# Patient Record
Sex: Female | Born: 1955
Health system: Southern US, Community
[De-identification: ages and names within clinical notes are randomized; demographics above are authoritative.]

## PROBLEM LIST (undated history)

## (undated) DIAGNOSIS — Z86718 Personal history of other venous thrombosis and embolism: Secondary | ICD-10-CM

## (undated) DIAGNOSIS — I10 Essential (primary) hypertension: Secondary | ICD-10-CM

## (undated) DIAGNOSIS — E785 Hyperlipidemia, unspecified: Secondary | ICD-10-CM

## (undated) DIAGNOSIS — Z8711 Personal history of peptic ulcer disease: Secondary | ICD-10-CM

## (undated) DIAGNOSIS — Z87442 Personal history of urinary calculi: Secondary | ICD-10-CM

## (undated) DIAGNOSIS — Z973 Presence of spectacles and contact lenses: Secondary | ICD-10-CM

## (undated) HISTORY — DX: Essential (primary) hypertension: I10

## (undated) HISTORY — DX: Hyperlipidemia, unspecified: E78.5

## (undated) HISTORY — DX: Personal history of urinary calculi: Z87.442

---

## 1989-01-27 DIAGNOSIS — Z86718 Personal history of other venous thrombosis and embolism: Secondary | ICD-10-CM

## 1989-01-27 HISTORY — DX: Personal history of other venous thrombosis and embolism: Z86.718

## 1992-01-28 HISTORY — PX: TUBAL LIGATION: SHX77

## 1996-01-28 DIAGNOSIS — Z8711 Personal history of peptic ulcer disease: Secondary | ICD-10-CM

## 1996-01-28 HISTORY — DX: Personal history of peptic ulcer disease: Z87.11

## 1997-07-03 ENCOUNTER — Ambulatory Visit (HOSPITAL_COMMUNITY): Admission: RE | Admit: 1997-07-03 | Discharge: 1997-07-03 | Payer: Self-pay | Admitting: Internal Medicine

## 1997-12-19 ENCOUNTER — Other Ambulatory Visit: Admission: RE | Admit: 1997-12-19 | Discharge: 1997-12-19 | Payer: Self-pay | Admitting: Internal Medicine

## 1998-11-17 ENCOUNTER — Emergency Department (HOSPITAL_COMMUNITY): Admission: EM | Admit: 1998-11-17 | Discharge: 1998-11-17 | Payer: Self-pay | Admitting: Emergency Medicine

## 1998-12-04 ENCOUNTER — Ambulatory Visit (HOSPITAL_COMMUNITY): Admission: RE | Admit: 1998-12-04 | Discharge: 1998-12-04 | Payer: Self-pay | Admitting: *Deleted

## 1999-02-07 ENCOUNTER — Encounter (INDEPENDENT_AMBULATORY_CARE_PROVIDER_SITE_OTHER): Payer: Self-pay | Admitting: *Deleted

## 1999-02-07 ENCOUNTER — Ambulatory Visit (HOSPITAL_COMMUNITY): Admission: RE | Admit: 1999-02-07 | Discharge: 1999-02-07 | Payer: Self-pay | Admitting: *Deleted

## 1999-08-12 ENCOUNTER — Encounter: Payer: Self-pay | Admitting: Internal Medicine

## 1999-08-12 ENCOUNTER — Ambulatory Visit (HOSPITAL_COMMUNITY): Admission: RE | Admit: 1999-08-12 | Discharge: 1999-08-12 | Payer: Self-pay | Admitting: Internal Medicine

## 2001-07-15 ENCOUNTER — Other Ambulatory Visit: Admission: RE | Admit: 2001-07-15 | Discharge: 2001-07-15 | Payer: Self-pay | Admitting: Family Medicine

## 2002-01-25 ENCOUNTER — Encounter: Admission: RE | Admit: 2002-01-25 | Discharge: 2002-01-25 | Payer: Self-pay | Admitting: Internal Medicine

## 2002-01-25 ENCOUNTER — Encounter: Payer: Self-pay | Admitting: Internal Medicine

## 2002-11-01 ENCOUNTER — Other Ambulatory Visit: Admission: RE | Admit: 2002-11-01 | Discharge: 2002-11-01 | Payer: Self-pay | Admitting: Family Medicine

## 2002-11-16 ENCOUNTER — Ambulatory Visit (HOSPITAL_COMMUNITY): Admission: RE | Admit: 2002-11-16 | Discharge: 2002-11-16 | Payer: Self-pay | Admitting: Family Medicine

## 2002-11-16 ENCOUNTER — Encounter: Payer: Self-pay | Admitting: Family Medicine

## 2002-11-21 ENCOUNTER — Encounter: Admission: RE | Admit: 2002-11-21 | Discharge: 2002-11-21 | Payer: Self-pay | Admitting: Family Medicine

## 2002-11-21 ENCOUNTER — Encounter: Payer: Self-pay | Admitting: Family Medicine

## 2003-11-02 ENCOUNTER — Other Ambulatory Visit: Admission: RE | Admit: 2003-11-02 | Discharge: 2003-11-02 | Payer: Self-pay | Admitting: Family Medicine

## 2003-11-22 ENCOUNTER — Encounter (INDEPENDENT_AMBULATORY_CARE_PROVIDER_SITE_OTHER): Payer: Self-pay | Admitting: *Deleted

## 2003-11-22 ENCOUNTER — Other Ambulatory Visit: Admission: RE | Admit: 2003-11-22 | Discharge: 2003-11-22 | Payer: Self-pay | Admitting: Hematology and Oncology

## 2003-11-23 ENCOUNTER — Encounter: Admission: RE | Admit: 2003-11-23 | Discharge: 2003-11-23 | Payer: Self-pay | Admitting: Family Medicine

## 2003-12-06 ENCOUNTER — Ambulatory Visit: Payer: Self-pay | Admitting: Hematology and Oncology

## 2004-11-18 ENCOUNTER — Ambulatory Visit: Payer: Self-pay | Admitting: Family Medicine

## 2004-11-24 ENCOUNTER — Encounter: Admission: RE | Admit: 2004-11-24 | Discharge: 2004-11-24 | Payer: Self-pay | Admitting: Family Medicine

## 2004-12-12 ENCOUNTER — Encounter: Admission: RE | Admit: 2004-12-12 | Discharge: 2004-12-12 | Payer: Self-pay | Admitting: Family Medicine

## 2005-02-05 ENCOUNTER — Ambulatory Visit: Payer: Self-pay | Admitting: Internal Medicine

## 2006-06-30 ENCOUNTER — Encounter: Admission: RE | Admit: 2006-06-30 | Discharge: 2006-06-30 | Payer: Self-pay | Admitting: Internal Medicine

## 2007-04-09 LAB — HM COLONOSCOPY: HM Colonoscopy: NORMAL

## 2007-07-23 ENCOUNTER — Encounter: Admission: RE | Admit: 2007-07-23 | Discharge: 2007-07-23 | Payer: Self-pay | Admitting: Internal Medicine

## 2008-08-14 ENCOUNTER — Encounter: Admission: RE | Admit: 2008-08-14 | Discharge: 2008-08-14 | Payer: Self-pay | Admitting: Obstetrics and Gynecology

## 2009-09-24 ENCOUNTER — Encounter: Admission: RE | Admit: 2009-09-24 | Discharge: 2009-09-24 | Payer: Self-pay | Admitting: Obstetrics and Gynecology

## 2010-06-14 NOTE — Procedures (Signed)
[facility]  Patient:    Brandy May                          MRN: [id number] Proc. Date: 02/07/99 Adm. Date:  [id number] Attending:  Sharyn Dross                           Procedure Report  REFERRING PHYSICIAN:  Sharyn Dross., M.D.  PREOPERATIVE DIAGNOSIS: 1. History of peptic ulcer disease with recurrent abdominal pain. 2. Found to have gastritis, as well as, esophagitis noted.  POSTOPERATIVE DIAGNOSIS:  Grossly normal endoscopic examination.  PROCEDURE:  Esophagogastroduodenoscopy.  MEDICATIONS:  Demerol 40 mg IV, Versed 4 mg IV over 10 minute period of time.  INSTRUMENT:  Olympus video panendoscope.  ENDOSCOPIST:  Sharyn Dross., M.D.  INDICATIONS:  This pleasant 55 year old female was recently seen and treated for persistent abdominal pains not responding to PPIs.  She was found to have to have gastritis, as well as, esophagitis that was present.  She was also checked for . pylori that was noted.  She was subsequently brought back in for reevaluation of the area at this time.  OBJECTIVE FINDINGS:  She is a pleasant female, appears to be in no acute distress. Her vital signs are stable.  HEENT EXAMINATION:  Anicteric.  NECK:  Supple.  LUNGS:  Clear.  HEART:  Regular rate and rhythm without heaves, thrills, murmurs or gallops.  ABDOMEN: Soft, mild tenderness to palpation, no rebound or referred tenderness appreciated.  DIGITAL RECTAL EXAMINATION:  Deferred.  EXTREMITIES:  Within normal limits.  PLAN:  Proceed with the endoscopic examination.  INFORMED CONSENT:  The patient was advised of procedure, indications and the risk involved.  The patient has agreed to have the procedure performed.  PREOPERATIVE PREPARATION:  The patient was brought in the endoscopy unit with an IV, for IV conscious sedating medication was started.  A monitor is placed on the patient to monitor the patients vital signs and oxygen  saturation.  Nasal oxygen at 2 L/min was used and after adequate sedation was performed, the procedure was begun.  PROCEDURE NOTE:  The instrument was advanced, the patient lying in the left lateral position via direct technique without difficulty.  The oropharyngeal, epiglottis, vocal cords and piriform sinuses appeared to be grossly within normal limits. he esophagus is normal without any evidence of acute inflammation, ulcerations, hiatal hernia or varices appreciated.  The gastric area showed a normal mucosa lake without any evidence of acute inflammation or ulcerations that was noted.  The antral area appeared to be within normal limits at this time.  The pylorus is normal with good peristaltic activity and upon advancing to the pyloric canal, duodenal bulb and second portion appeared to be within normal limits.  The instrument was retracted back, where biopsies or the CLO study is performed.  Retroflexed view of the cardia revealed no gross pathology at this time.  The Z-line appeared to be approximately 40 cm distal esophagus that was noted.  The instrument was retracted back.  There was no evidence of any postendoscopic trauma noted.  The instrument was subsequently removed per orum without difficulty with the tolerating the procedure well.  TREATMENT: 1. Conservative management during the interim at this time. 2. Await the results of the CLO study. 3. Have the patient follow-up in the office on routine. DD:  02/07/99 TD:  02/07/99 Job: [id number]  [id number]

## 2010-06-14 NOTE — Procedures (Signed)
. [facility]  Patient:    Brandy May                          MRN: [id number] Proc. Date: 12/04/98 Adm. Date:  [id number] Attending:  Sharyn Dross CC:         Lindell Spar. Chestine Spore, M.D.                           Procedure Report  PREOPERATIVE DIAGNOSIS:  Abdominal pains.  POSTOPERATIVE DIAGNOSIS: 1. Distal esophagitis. 2. Mild duodenitis in the postbulbar region.  PROCEDURE PERFORMED:  Esophagogastroduodenoscopy with biopsies.  MEDICATIONS USED:  Demerol 40 mg IV, Versed 5 mg IV over a 10-minute period of time.  INSTRUMENT USED:  Olympus video panendoscope.  ENDOSCOPIST:  Sharyn Dross., M.D.  INDICATIONS:  This pleasant female was referred for evaluation because of persistent abdominal pains and discomforts.  The pains appeared to be in the periumbilical region at this time.  Her pains were not associated with any nauseousness or discomforts at this time.  The patient does admit to having a history of peptic ulcer disease in the past.  The patient has not been treated or ulcerations for a long period of time.  The patient denies any history of any hematemesis or melenotic stools that was noted at this time.  OBJECTIVE FINDINGS:  She is a pleasant female who appears to be in no acute distress.  Her vital signs are stable.  Her HEENT examination is anicteric. The neck was supple.  Lungs were clear.  The heart had a regular rate and rhythm without heaves, thrills, murmurs or gallops.  The abdomen was soft.  There was ild tenderness to palpation in the superumbilical region.  No rebound or referred tenderness appreciated.  Digital rectal exam was deferred.  Extremities showed o clubbing, cyanosis or edema.  PLAN:  I am going to proceed with the endoscopic examination.  INFORMED CONSENT:  The patient was advised of the procedure, indications and the risks involved.  The patient has agreed to have the procedure performed at  this  time.  A video was reviewed and the consent form was obtained.  PREOPERATIVE PREPARATION:  The patient was brought to the endoscopy unit where n IV for IV conscious sedating medication was started.  A monitor was placed on the patient to monitor the patients vital signs and oxygen saturation.  Nasal oxygen at two liters per minute was used and after adequate sedation was performed, the procedure was begun.  DESCRIPTION OF PROCEDURE:  The instrument was advanced with the patient in the eft lateral position via the direct technique without difficulty.  The oropharyngeal epliglottis, vocal cords and piriform sinuses appeared to be grossly within normal limits.  The esophagus was normal without any evidence of acute inflammation, ulcerations, or varices appreciated.  There was evidence of another distal esophagitis that was appreciated appeared to be mild in character at this time.  Photographs were taken of the region.  The gastric area showed a normal mucous lake without any evidence of acute inflammation or ulcerations that was noted.  The pylorus was normal with good peristaltic activity.  Upon advancement through the pyloric canal, the duodenal  bulb showed evidence of postbulbar duodenitis that was noted.  The second portion of the duodenum appeared to be unremarkable.  The instrument was subsequently retracted back where the biopsy for the CLO study was  performed.  Retroflex view of the cardia revealed no evidence of a hiatal hernia and the Z-line appeared to be 40 cm distal esophagus.  The instrument was retracted back where again the inflammation was noted at this time.  There did not appear to be evidence of a Barretts esophagus that was appreciated at this time.  The instrument was subsequently removed per orum without difficulty.  The patient tolerated the procedure well.  TREATMENT: 1. Will treat the patient with a PPI at this time. 2. Have the patient  follow up with me in the office in the next few weeks. 3. Await the results of the CLO study depending on the results to determine the  course of therapy. DD:  12/04/98 TD:  12/05/98 Job: [id number]

## 2010-07-19 ENCOUNTER — Ambulatory Visit (HOSPITAL_BASED_OUTPATIENT_CLINIC_OR_DEPARTMENT_OTHER)
Admission: RE | Admit: 2010-07-19 | Discharge: 2010-07-19 | Disposition: A | Discharge status: Home / Self Care | Payer: PRIVATE HEALTH INSURANCE | Source: Ambulatory Visit | Attending: Orthopedic Surgery | Admitting: Orthopedic Surgery

## 2010-07-19 DIAGNOSIS — M23302 Other meniscus derangements, unspecified lateral meniscus, unspecified knee: Secondary | ICD-10-CM | POA: Insufficient documentation

## 2010-07-19 DIAGNOSIS — Z01810 Encounter for preprocedural cardiovascular examination: Secondary | ICD-10-CM | POA: Insufficient documentation

## 2010-07-19 DIAGNOSIS — I10 Essential (primary) hypertension: Secondary | ICD-10-CM | POA: Insufficient documentation

## 2010-07-19 DIAGNOSIS — Z01812 Encounter for preprocedural laboratory examination: Secondary | ICD-10-CM | POA: Insufficient documentation

## 2010-07-19 HISTORY — PX: KNEE ARTHROSCOPY: SUR90

## 2010-07-19 LAB — POCT HEMOGLOBIN-HEMACUE: Hemoglobin: 14.2 g/dL (ref 12.0–15.0)

## 2010-07-24 NOTE — Op Note (Signed)
  Brandy May, Brandy May                ACCOUNT NO.:  [id number]  MEDICAL RECORD NO.:  [id number]  LOCATION:                                 FACILITY:  PHYSICIAN:  Marlowe Kays, M.D.  DATE OF BIRTH:  [birth year]  DATE OF PROCEDURE:  07/19/2010 DATE OF DISCHARGE:                              OPERATIVE REPORT   PREOPERATIVE DIAGNOSIS:  Torn lateral meniscus, right knee.  POSTOPERATIVE DIAGNOSIS:  Torn lateral meniscus, right knee.  OPERATION:  Right knee arthroscopy with partial lateral meniscectomy.  SURGEON:  Marlowe Kays, M.D.  ASSISTANT:  Nurse.  ANESTHESIA:  General.  PLAN/JUSTIFICATION FOR PROCEDURE:  Because of painful right knee since November of 2011, had an MRI performed on March 22, 2010, which demonstrated a tear of the lateral meniscus which appeared to be partially discoid.  The remainder of the knee looked relatively unremarkable.  Because of persistent symptomatology, she is here today for the above-mentioned surgery.  DESCRIPTION OF PROCEDURE:  Satisfied general anesthesia, Ace wrap and knee support to left lower extremity, pneumatic tourniquet applied to right lower extremity with leg Esmarch out nonsterilely and tourniquet inflated to 350 mmHg.  Thigh stabilizer applied.  Right leg was then prepped with DuraPrep from stabilizer to ankle, draped in sterile field. Time-out performed.  Superomedial saline inflow.  First an anterolateral portal of medial compartment of knee joint was evaluated. Representative pictures were taken.  It was normal.  Looking at the medial gutter and subpatellar area, no significant wear of the patella was noted.  I then reversed portals.  ACL looked intact.  She had a very superficial defect in the lateral femoral condyle articular cartilage adjacent to the underlying tear of the midportion of the lateral meniscus.  There was also small tear in the posterior curve as well. After picturing these, I used a 3.5 shaver to be able  to shave down both tears to stable rim on probing.  Final pictures were taken.  Knee joint was irrigated to clear all fluid possible removed,  closed 2 entry portals with a 4-0 nylon and then injected through the inflow apparatus 20 cc of 0.5% Marcaine with adrenaline, 4 mg of morphine.  I then closed this portal with 4-0 nylon as well.  Betadine, Adaptic, dry sterile dressing were applied.  Tourniquet was released.  At time of this dictation, she was on her way to recovery room in satisfactory condition with no known complications.          ______________________________ Marlowe Kays, M.D.     JA/MEDQ  D:  07/19/2010  T:  07/19/2010  Job:  [id number]  Electronically Signed by Marlowe Kays M.D. on 07/24/2010 02:23:15 PM

## 2010-09-02 ENCOUNTER — Other Ambulatory Visit: Payer: Self-pay | Admitting: Obstetrics and Gynecology

## 2010-09-02 DIAGNOSIS — Z1231 Encounter for screening mammogram for malignant neoplasm of breast: Secondary | ICD-10-CM

## 2010-10-01 ENCOUNTER — Ambulatory Visit
Admission: RE | Admit: 2010-10-01 | Discharge: 2010-10-01 | Disposition: A | Discharge status: Home / Self Care | Payer: PRIVATE HEALTH INSURANCE | Source: Ambulatory Visit | Attending: Obstetrics and Gynecology | Admitting: Obstetrics and Gynecology

## 2010-10-01 DIAGNOSIS — Z1231 Encounter for screening mammogram for malignant neoplasm of breast: Secondary | ICD-10-CM

## 2010-10-08 ENCOUNTER — Other Ambulatory Visit: Payer: Self-pay | Admitting: Obstetrics and Gynecology

## 2010-10-08 DIAGNOSIS — R928 Other abnormal and inconclusive findings on diagnostic imaging of breast: Secondary | ICD-10-CM

## 2010-10-25 ENCOUNTER — Ambulatory Visit
Admission: RE | Admit: 2010-10-25 | Discharge: 2010-10-25 | Disposition: A | Discharge status: Home / Self Care | Payer: PRIVATE HEALTH INSURANCE | Source: Ambulatory Visit | Attending: Obstetrics and Gynecology | Admitting: Obstetrics and Gynecology

## 2010-10-25 DIAGNOSIS — R928 Other abnormal and inconclusive findings on diagnostic imaging of breast: Secondary | ICD-10-CM

## 2010-11-29 ENCOUNTER — Ambulatory Visit: Payer: PRIVATE HEALTH INSURANCE | Admitting: Family Medicine

## 2010-12-17 ENCOUNTER — Ambulatory Visit (INDEPENDENT_AMBULATORY_CARE_PROVIDER_SITE_OTHER): Payer: PRIVATE HEALTH INSURANCE | Admitting: Family Medicine

## 2010-12-17 ENCOUNTER — Encounter: Payer: Self-pay | Admitting: Family Medicine

## 2010-12-17 DIAGNOSIS — Z86718 Personal history of other venous thrombosis and embolism: Secondary | ICD-10-CM

## 2010-12-17 DIAGNOSIS — Z8759 Personal history of other complications of pregnancy, childbirth and the puerperium: Secondary | ICD-10-CM

## 2010-12-17 DIAGNOSIS — I10 Essential (primary) hypertension: Secondary | ICD-10-CM

## 2010-12-17 DIAGNOSIS — E538 Deficiency of other specified B group vitamins: Secondary | ICD-10-CM

## 2010-12-17 DIAGNOSIS — E785 Hyperlipidemia, unspecified: Secondary | ICD-10-CM

## 2010-12-17 NOTE — Patient Instructions (Signed)
Follow up in March to repeat your cholesterol tests You look great!  Keep up the good work! Call with any questions or concerns Welcome!  We're glad to have you! Happy Holidays!!!

## 2010-12-17 NOTE — Progress Notes (Signed)
  Subjective:    Patient ID: Brandy May, female    DOB: [birth year], 55 y.o.   MRN: [id number]  HPI New to establish.  Previous MD- Dr Elisabeth Most.  GYN- Cousins.  UTD on pap and mammo.  UTD on colonoscopy (age 6).  HTN- chronic problem, on Norvasc.  No Cp, SOB, HAs, visual changes, edema.  Hyperlipidemia- chronic problem, on Crestor.  Last labs done 10/01/10.  No N/V, myalgias.  Vit B12 deficiency- according to recent labs pt was deficient.  Taking oral B12- not injxns.  Hx of DVT w/ 2nd pregnancy.  No clots since.  Not on anticoagulation.   Review of Systems For ROS see HPI     Objective:   Physical Exam  Vitals reviewed. Constitutional: She is oriented to person, place, and time. She appears well-developed and well-nourished. No distress.  HENT:  Head: Normocephalic and atraumatic.  Eyes: Conjunctivae and EOM are normal. Pupils are equal, round, and reactive to light.  Neck: Normal range of motion. Neck supple. No thyromegaly present.  Cardiovascular: Normal rate, regular rhythm, normal heart sounds and intact distal pulses.   No murmur heard. Pulmonary/Chest: Effort normal and breath sounds normal. No respiratory distress.  Abdominal: Soft. She exhibits no distension. There is no tenderness.  Musculoskeletal: She exhibits no edema.  Lymphadenopathy:    She has no cervical adenopathy.  Neurological: She is alert and oriented to person, place, and time.  Skin: Skin is warm and dry.  Psychiatric: She has a normal mood and affect. Her behavior is normal.          Assessment & Plan:

## 2010-12-29 DIAGNOSIS — I10 Essential (primary) hypertension: Secondary | ICD-10-CM | POA: Insufficient documentation

## 2010-12-29 DIAGNOSIS — E538 Deficiency of other specified B group vitamins: Secondary | ICD-10-CM | POA: Insufficient documentation

## 2010-12-29 DIAGNOSIS — E785 Hyperlipidemia, unspecified: Secondary | ICD-10-CM | POA: Insufficient documentation

## 2010-12-29 DIAGNOSIS — Z86718 Personal history of other venous thrombosis and embolism: Secondary | ICD-10-CM | POA: Insufficient documentation

## 2010-12-29 NOTE — Assessment & Plan Note (Signed)
Chronic problem.  Well controlled on current meds.  Asymptomatic.  No changes. 

## 2010-12-29 NOTE — Assessment & Plan Note (Signed)
Chronic problem.  On Crestor- tolerating well.  Asymptomatic.  Labs done in September.  No changes.

## 2010-12-29 NOTE — Assessment & Plan Note (Signed)
New based on recent labs.  Taking oral supplement and not injxns.  Will need repeat labs to determine if oral absorption is adequate.

## 2010-12-29 NOTE — Assessment & Plan Note (Signed)
DVT occurred during her 2nd pregnancy.  No problems since.  Will follow.

## 2011-04-11 ENCOUNTER — Encounter: Payer: Self-pay | Admitting: Family Medicine

## 2011-04-11 ENCOUNTER — Ambulatory Visit (INDEPENDENT_AMBULATORY_CARE_PROVIDER_SITE_OTHER): Payer: PRIVATE HEALTH INSURANCE | Admitting: Family Medicine

## 2011-04-11 VITALS — BP 120/85 | HR 84 | Temp 98.4°F | Ht 65.75 in | Wt 160.8 lb

## 2011-04-11 DIAGNOSIS — E538 Deficiency of other specified B group vitamins: Secondary | ICD-10-CM

## 2011-04-11 DIAGNOSIS — E785 Hyperlipidemia, unspecified: Secondary | ICD-10-CM

## 2011-04-11 DIAGNOSIS — E010 Iodine-deficiency related diffuse (endemic) goiter: Secondary | ICD-10-CM

## 2011-04-11 DIAGNOSIS — E049 Nontoxic goiter, unspecified: Secondary | ICD-10-CM

## 2011-04-11 DIAGNOSIS — I10 Essential (primary) hypertension: Secondary | ICD-10-CM

## 2011-04-11 LAB — CBC WITH DIFFERENTIAL/PLATELET
Basophils Absolute: 0 10*3/uL (ref 0.0–0.1)
Basophils Relative: 0.7 % (ref 0.0–3.0)
Eosinophils Absolute: 0.1 10*3/uL (ref 0.0–0.7)
Eosinophils Relative: 2.4 % (ref 0.0–5.0)
Lymphocytes Relative: 28.2 % (ref 12.0–46.0)
Lymphs Abs: 1 10*3/uL (ref 0.7–4.0)
Monocytes Absolute: 0.3 10*3/uL (ref 0.1–1.0)
Neutrophils Relative %: 61.8 % (ref 43.0–77.0)
Platelets: 209 10*3/uL (ref 150.0–400.0)
RDW: 13 % (ref 11.5–14.6)

## 2011-04-11 LAB — LIPID PANEL
HDL: 45.8 mg/dL (ref >39.00)
Total CHOL/HDL Ratio: 3
Triglycerides: 72 mg/dL (ref 0.0–149.0)
VLDL: 14.4 mg/dL (ref 0.0–40.0)

## 2011-04-11 LAB — BASIC METABOLIC PANEL
BUN: 15 mg/dL (ref 6–23)
CO2: 27 mEq/L (ref 19–32)
Chloride: 105 mEq/L (ref 96–112)
GFR: 94.74 mL/min (ref >60.00)
Glucose, Bld: 98 mg/dL (ref 70–99)
Potassium: 3.7 mEq/L (ref 3.5–5.1)

## 2011-04-11 LAB — TSH: TSH: 0.69 u[IU]/mL (ref 0.35–5.50)

## 2011-04-11 LAB — HEPATIC FUNCTION PANEL
ALT: 17 U/L (ref 0–35)
Total Protein: 7.7 g/dL (ref 6.0–8.3)

## 2011-04-11 NOTE — Progress Notes (Signed)
Addended by: Silvio Pate D on: 04/11/2011 04:46 PM   Modules accepted: Orders

## 2011-04-11 NOTE — Progress Notes (Signed)
  Subjective:    Patient ID: Brandy May, female    DOB: [birth year], 56 y.o.   MRN: [id number]  HPI HTN- chronic problem, well controlled on Norvasc.  No CP, SOB, HAs, visual changes, edema.  Hyperlipidemia- chronic problem, on Crestor.  No abd pain, N/V/D, myalgias.  B12 deficiency- taking oral B12, no fatigue, pallor.  Due for labs.   Review of Systems For ROS see HPI     Objective:   Physical Exam  Vitals reviewed. Constitutional: She is oriented to person, place, and time. She appears well-developed and well-nourished. No distress.  HENT:  Head: Normocephalic and atraumatic.  Eyes: Conjunctivae and EOM are normal. Pupils are equal, round, and reactive to light.  Neck: Normal range of motion. Neck supple. No thyromegaly present.  Cardiovascular: Normal rate, regular rhythm, normal heart sounds and intact distal pulses.   No murmur heard. Pulmonary/Chest: Effort normal and breath sounds normal. No respiratory distress.  Abdominal: Soft. She exhibits no distension. There is no tenderness.  Musculoskeletal: She exhibits no edema.  Lymphadenopathy:    She has no cervical adenopathy.  Neurological: She is alert and oriented to person, place, and time.  Skin: Skin is warm and dry.  Psychiatric: She has a normal mood and affect. Her behavior is normal.          Assessment & Plan:

## 2011-04-11 NOTE — Assessment & Plan Note (Signed)
New.  Get labs and US to assess. 

## 2011-04-11 NOTE — Patient Instructions (Signed)
We'll notify you of your ultrasound appt We'll notify you of your lab results and make any changes if needed Keep up the good work!  You look great!!! Schedule your complete physical for September Call with any questions or concerns Happy Bel[employer] Birthday!

## 2011-04-11 NOTE — Assessment & Plan Note (Signed)
Chronic problem.  Well controlled.  Asymptomatic.  No changes at this time. 

## 2011-04-11 NOTE — Assessment & Plan Note (Signed)
Taking oral supplement.  Check labs.  Start parenteral replacement prn.

## 2011-04-11 NOTE — Assessment & Plan Note (Signed)
Chronic problem.  Tolerating statin w/out difficulty.  Check labs.  Adjust meds prn  

## 2011-04-17 ENCOUNTER — Encounter: Payer: Self-pay | Admitting: *Deleted

## 2011-04-18 ENCOUNTER — Other Ambulatory Visit (HOSPITAL_BASED_OUTPATIENT_CLINIC_OR_DEPARTMENT_OTHER): Payer: PRIVATE HEALTH INSURANCE

## 2011-04-18 ENCOUNTER — Ambulatory Visit: Payer: PRIVATE HEALTH INSURANCE | Admitting: Family Medicine

## 2011-04-18 ENCOUNTER — Ambulatory Visit (HOSPITAL_BASED_OUTPATIENT_CLINIC_OR_DEPARTMENT_OTHER)
Admission: RE | Admit: 2011-04-18 | Discharge: 2011-04-18 | Disposition: A | Discharge status: Home / Self Care | Payer: PRIVATE HEALTH INSURANCE | Source: Ambulatory Visit | Attending: Family Medicine | Admitting: Family Medicine

## 2011-04-18 DIAGNOSIS — E042 Nontoxic multinodular goiter: Secondary | ICD-10-CM

## 2011-04-18 DIAGNOSIS — E010 Iodine-deficiency related diffuse (endemic) goiter: Secondary | ICD-10-CM

## 2011-05-19 ENCOUNTER — Other Ambulatory Visit: Payer: Self-pay | Admitting: Family Medicine

## 2011-05-19 MED ORDER — AMLODIPINE BESYLATE 5 MG PO TABS: 5.0000 mg | ORAL_TABLET | Freq: Every day | ORAL | Status: DC

## 2011-05-19 NOTE — Telephone Encounter (Signed)
refill Amlodipine Besylate 5MG  tab Qty 30 Take 1-tablet every day  Last filled 03.13.2013  Last OV 03.15.2013

## 2011-05-19 NOTE — Telephone Encounter (Signed)
rx sent to pharmacy by e-script  

## 2011-05-30 ENCOUNTER — Telehealth: Payer: Self-pay | Admitting: Family Medicine

## 2011-05-30 MED ORDER — ROSUVASTATIN CALCIUM 10 MG PO TABS: 10.0000 mg | ORAL_TABLET | Freq: Every day | ORAL | Status: DC

## 2011-05-30 NOTE — Telephone Encounter (Signed)
rx sent to pharmacy by e-script  

## 2011-05-30 NOTE — Telephone Encounter (Signed)
Refill: Crestor 10mg  tablet #30. Take 1 tablet by mouth every day. Last fill 04-18-11

## 2011-09-21 ENCOUNTER — Other Ambulatory Visit: Payer: Self-pay | Admitting: Family Medicine

## 2011-09-22 NOTE — Telephone Encounter (Signed)
rx sent to pharmacy by e-script  

## 2011-09-30 ENCOUNTER — Encounter: Payer: PRIVATE HEALTH INSURANCE | Admitting: Family Medicine

## 2011-10-03 ENCOUNTER — Encounter: Payer: Self-pay | Admitting: Family Medicine

## 2011-10-03 ENCOUNTER — Ambulatory Visit (INDEPENDENT_AMBULATORY_CARE_PROVIDER_SITE_OTHER): Payer: Commercial Managed Care - PPO | Admitting: Family Medicine

## 2011-10-03 VITALS — BP 124/83 | HR 94 | Temp 98.2°F | Ht 65.0 in | Wt 161.8 lb

## 2011-10-03 DIAGNOSIS — Z0000 Encounter for general adult medical examination without abnormal findings: Secondary | ICD-10-CM | POA: Insufficient documentation

## 2011-10-03 DIAGNOSIS — E538 Deficiency of other specified B group vitamins: Secondary | ICD-10-CM

## 2011-10-03 DIAGNOSIS — I10 Essential (primary) hypertension: Secondary | ICD-10-CM

## 2011-10-03 DIAGNOSIS — E785 Hyperlipidemia, unspecified: Secondary | ICD-10-CM

## 2011-10-03 LAB — LIPID PANEL
Cholesterol: 143 mg/dL (ref 0–200)
HDL: 48.9 mg/dL (ref >39.00)
VLDL: 12.6 mg/dL (ref 0.0–40.0)

## 2011-10-03 LAB — CBC WITH DIFFERENTIAL/PLATELET
Basophils Absolute: 0 10*3/uL (ref 0.0–0.1)
Basophils Relative: 0.6 % (ref 0.0–3.0)
Eosinophils Absolute: 0.1 10*3/uL (ref 0.0–0.7)
Eosinophils Relative: 2.1 % (ref 0.0–5.0)
HCT: 39.9 % (ref 36.0–46.0)
Hemoglobin: 13.2 g/dL (ref 12.0–15.0)
Lymphocytes Relative: 34.5 % (ref 12.0–46.0)
MCHC: 33.2 g/dL (ref 30.0–36.0)
Monocytes Relative: 6 % (ref 3.0–12.0)
Neutrophils Relative %: 56.8 % (ref 43.0–77.0)
Platelets: 183 10*3/uL (ref 150.0–400.0)
RBC: 3.87 Mil/uL (ref 3.87–5.11)
RDW: 12.9 % (ref 11.5–14.6)

## 2011-10-03 LAB — BASIC METABOLIC PANEL
CO2: 22 mEq/L (ref 19–32)
Creatinine, Ser: 0.8 mg/dL (ref 0.4–1.2)
GFR: 90.68 mL/min (ref >60.00)

## 2011-10-03 LAB — HEPATIC FUNCTION PANEL
ALT: 13 U/L (ref 0–35)
AST: 21 U/L (ref 0–37)
Albumin: 3.9 g/dL (ref 3.5–5.2)
Alkaline Phosphatase: 46 U/L (ref 39–117)

## 2011-10-03 LAB — TSH: TSH: 1.2 u[IU]/mL (ref 0.35–5.50)

## 2011-10-03 NOTE — Assessment & Plan Note (Signed)
Chronic problem, check labs.  Adjust meds prn. 

## 2011-10-03 NOTE — Assessment & Plan Note (Signed)
Pt's PE WNL- small multinodular goiter (ultrasound done 3/13).  UTD on GYN and colonoscopy.  Check labs.  Anticipatory guidance provided.

## 2011-10-03 NOTE — Progress Notes (Signed)
  Subjective:    Patient ID: Brandy May, female    DOB: [birth year], 56 y.o.   MRN: [id number]  HPI CPE- PT UTD on GYN ([facility]) and colonoscopy.  No concerns today.  BP well controlled.   Review of Systems Patient reports no vision/ hearing changes, adenopathy,fever, weight change,  persistant/recurrent hoarseness , swallowing issues, chest pain, palpitations, edema, persistant/recurrent cough, hemoptysis, dyspnea (rest/exertional/paroxysmal nocturnal), gastrointestinal bleeding (melena, rectal bleeding), abdominal pain, significant heartburn, bowel changes, GU symptoms (dysuria, hematuria, incontinence), Gyn symptoms (abnormal  bleeding, pain),  syncope, focal weakness, memory loss, numbness & tingling, skin/hair/nail changes, abnormal bruising or bleeding, anxiety, or depression.     Objective:   Physical Exam General Appearance:    Alert, cooperative, no distress, appears stated age  Head:    Normocephalic, without obvious abnormality, atraumatic  Eyes:    PERRL, conjunctiva/corneas clear, EOM's intact, fundi    benign, both eyes  Ears:    Normal TM's and external ear canals, both ears  Nose:   Nares normal, septum midline, mucosa normal, no drainage    or sinus tenderness  Throat:   Lips, mucosa, and tongue normal; teeth and gums normal  Neck:   Supple, symmetrical, trachea midline, no adenopathy;    Thyroid: small multinodular goiter  Back:     Symmetric, no curvature, ROM normal, no CVA tenderness  Lungs:     Clear to auscultation bilaterally, respirations unlabored  Chest Wall:    No tenderness or deformity   Heart:    Regular rate and rhythm, S1 and S2 normal, no murmur, rub   or gallop  Breast Exam:    Deferred to GYN  Abdomen:     Soft, non-tender, bowel sounds active all four quadrants,    no masses, no organomegaly  Genitalia:    Deferred to GYN  Rectal:    Extremities:   Extremities normal, atraumatic, no cyanosis or edema  Pulses:   2+ and symmetric all extremities    Skin:   Skin color, texture, turgor normal, no rashes or lesions  Lymph nodes:   Cervical, supraclavicular, and axillary nodes normal  Neurologic:   CNII-XII intact, normal strength, sensation and reflexes    throughout          Assessment & Plan:

## 2011-10-03 NOTE — Patient Instructions (Addendum)
Follow up in 6 months to recheck cholesterol and BP You look great!  Keep up the good work! We'll notify you of your lab results Have a wonderful trip!!!

## 2011-10-03 NOTE — Assessment & Plan Note (Signed)
Chronic problem, well controlled.  Asymptomatic.  No changes. 

## 2011-10-03 NOTE — Assessment & Plan Note (Signed)
Check labs.  Replete prn. 

## 2011-10-08 ENCOUNTER — Encounter: Payer: Self-pay | Admitting: *Deleted

## 2011-10-30 ENCOUNTER — Other Ambulatory Visit: Payer: Self-pay | Admitting: Obstetrics and Gynecology

## 2011-10-30 DIAGNOSIS — Z1231 Encounter for screening mammogram for malignant neoplasm of breast: Secondary | ICD-10-CM

## 2011-10-31 ENCOUNTER — Ambulatory Visit
Admission: RE | Admit: 2011-10-31 | Discharge: 2011-10-31 | Disposition: A | Discharge status: Home / Self Care | Payer: Commercial Managed Care - PPO | Source: Ambulatory Visit | Attending: Obstetrics and Gynecology | Admitting: Obstetrics and Gynecology

## 2011-10-31 DIAGNOSIS — Z1231 Encounter for screening mammogram for malignant neoplasm of breast: Secondary | ICD-10-CM

## 2011-11-03 ENCOUNTER — Other Ambulatory Visit: Payer: Self-pay | Admitting: Obstetrics and Gynecology

## 2011-11-03 DIAGNOSIS — R928 Other abnormal and inconclusive findings on diagnostic imaging of breast: Secondary | ICD-10-CM

## 2011-11-14 ENCOUNTER — Ambulatory Visit
Admission: RE | Admit: 2011-11-14 | Discharge: 2011-11-14 | Disposition: A | Discharge status: Home / Self Care | Payer: Commercial Managed Care - PPO | Source: Ambulatory Visit | Attending: Obstetrics and Gynecology | Admitting: Obstetrics and Gynecology

## 2011-11-14 ENCOUNTER — Other Ambulatory Visit: Payer: Self-pay | Admitting: Family Medicine

## 2011-11-14 DIAGNOSIS — R928 Other abnormal and inconclusive findings on diagnostic imaging of breast: Secondary | ICD-10-CM

## 2011-11-14 MED ORDER — AMLODIPINE BESYLATE 5 MG PO TABS: 5.0000 mg | ORAL_TABLET | Freq: Every day | ORAL | Status: DC

## 2011-11-14 NOTE — Telephone Encounter (Signed)
rx sent to pharmacy by e-script  

## 2011-11-14 NOTE — Telephone Encounter (Signed)
refill amlodipine besylate 5mg  tab #30 wt/5-refills -- Take 1 tablet by mouth daily -- last fill 9.18.13  last ov 9.6.13 V70

## 2011-11-26 ENCOUNTER — Telehealth: Payer: Self-pay | Admitting: Family Medicine

## 2011-11-26 MED ORDER — ROSUVASTATIN CALCIUM 10 MG PO TABS: 10.0000 mg | ORAL_TABLET | Freq: Every day | ORAL | Status: DC

## 2011-11-26 NOTE — Telephone Encounter (Signed)
rx sent to pharmacy by e-script  

## 2011-11-26 NOTE — Telephone Encounter (Signed)
Refill: Crestor 10mg  tablet. Take 1 tablet by mouth daily. Qty 30. Last fill 9.24.13

## 2011-12-29 ENCOUNTER — Ambulatory Visit (INDEPENDENT_AMBULATORY_CARE_PROVIDER_SITE_OTHER): Payer: Commercial Managed Care - PPO | Admitting: Internal Medicine

## 2011-12-29 ENCOUNTER — Encounter: Payer: Self-pay | Admitting: Internal Medicine

## 2011-12-29 VITALS — BP 148/82 | HR 84 | Temp 98.0°F | Wt 166.0 lb

## 2011-12-29 DIAGNOSIS — N390 Urinary tract infection, site not specified: Secondary | ICD-10-CM | POA: Insufficient documentation

## 2011-12-29 LAB — POCT URINALYSIS DIPSTICK
Ketones, UA: NEGATIVE
Protein, UA: NEGATIVE
Spec Grav, UA: 1.01
pH, UA: 6.5

## 2011-12-29 MED ORDER — CIPROFLOXACIN HCL 500 MG PO TABS: 500.0000 mg | ORAL_TABLET | Freq: Two times a day (BID) | ORAL | Status: DC

## 2011-12-29 NOTE — Patient Instructions (Addendum)
Drink plenty of fluids, take ciprofloxacin twice a day for 3 days, call if you are not completely well  in few days, call if the symptoms return.

## 2011-12-29 NOTE — Assessment & Plan Note (Signed)
Sx and Udip c/w UTI Empiric Cipro, see instructions

## 2011-12-29 NOTE — Progress Notes (Signed)
  Subjective:    Patient ID: Brandy May, female    DOB: [birth year], 56 y.o.   MRN: [id number]  HPI Acute visit Last week developed mild urinary urgency, 3 days ago developed terminal dysuria. She has been taking cranberry juice. No UTI in the  last 20 years, the last one was severe and required a Foley catheter.  Past Medical History  Diagnosis Date  . Chicken pox   . Ulcer   . Allergy   . Hyperlipidemia   . Clotting disorder     only with second pregnancy  . Blood transfusion   . Hypertension    Past Surgical History  Procedure Date  . Tubal ligation 1994  . Knee arthroplasty 2012  . Cesarean section 1989 & 1991    Review of Systems No fever or chills No flank or abdominal pain. No nausea, vomiting. She does have a feeling that can't empty her bladder all the way. Last menstrual period 2 weeks ago, on birth control pills.     Objective:   Physical Exam General -- alert, well-developed, and well-nourished.   Abdomen--soft, minimal tenderness at the suprapubic area without mass or rebound; no distention, no CVA tenderness  Neurologic-- alert & oriented X3 and strength normal in all extremities. Psych-- Cognition and judgment appear intact. Alert and cooperative with normal attention span and concentration.  not anxious appearing and not depressed appearing.       Assessment & Plan:

## 2011-12-31 LAB — URINE CULTURE

## 2012-01-28 HISTORY — PX: OTHER SURGICAL HISTORY: SHX169

## 2012-01-30 ENCOUNTER — Encounter: Payer: Self-pay | Admitting: Family Medicine

## 2012-01-30 ENCOUNTER — Ambulatory Visit (INDEPENDENT_AMBULATORY_CARE_PROVIDER_SITE_OTHER): Payer: Commercial Managed Care - PPO | Admitting: Family Medicine

## 2012-01-30 VITALS — BP 130/80 | HR 98 | Temp 98.7°F | Ht 64.75 in | Wt 164.2 lb

## 2012-01-30 DIAGNOSIS — R1032 Left lower quadrant pain: Secondary | ICD-10-CM

## 2012-01-30 MED ORDER — CYCLOBENZAPRINE HCL 10 MG PO TABS: 10.0000 mg | ORAL_TABLET | Freq: Three times a day (TID) | ORAL | Status: DC | PRN

## 2012-01-30 MED ORDER — NAPROXEN 500 MG PO TABS: 500.0000 mg | ORAL_TABLET | Freq: Two times a day (BID) | ORAL | Status: DC

## 2012-01-30 NOTE — Patient Instructions (Addendum)
This appears to be consistent w/ tendonitis Start the Naproxen twice daily, w/ food, for 7-10 days and then as needed Use the flexeril at night for sleep and muscle spasm Heat or ice- whichever feels better If no improvement in 2-3 weeks, call me and we'll set you up w/ ortho Call with any questions or concerns Hang in there! Happy New Year!!!

## 2012-01-30 NOTE — Progress Notes (Signed)
  Subjective:    Patient ID: Brandy May, female    DOB: [birth year], 57 y.o.   MRN: [id number]  HPI L groin pain- sxs started 'at least September, maybe earlier'.  Initially thought it was a pulled muscle from walking but pain has not improved.  Pain has actually worsened somewhat.  Pain w/ hip flexion.  Mild weakness in leg- 'maybe it's b/c i'm favoring it'.  No numbness or tingling.  No changes to bowel/bladder habits.  Has not tried OTC meds.  Pain will improve w/ continued activity.  Now having trouble getting into comfortable position at night.   Review of Systems For ROS see HPI     Objective:   Physical Exam  Vitals reviewed. Constitutional: She is oriented to person, place, and time. She appears well-developed and well-nourished. No distress.  Cardiovascular: Intact distal pulses.   Musculoskeletal: She exhibits tenderness (over large hip flexor tendon). She exhibits no edema.       No pain w/ full hip flexion but pain w/ extension to return to normal position Pain w/ external rotation, right over hip flexor Some pain w/ internal rotation SI joints appear symmetric, nontender, no apparent leg length inequality  Neurological: She is alert and oriented to person, place, and time. She has normal reflexes. No cranial nerve deficit. Coordination normal.          Assessment & Plan:

## 2012-01-30 NOTE — Assessment & Plan Note (Signed)
New.  Suspect this is due to hip flexor tendonitis but may be true hip pathology if no improvement w/ NSAIDs and flexeril.  Reviewed supportive care and red flags that should prompt return.  Pt expressed understanding and is in agreement w/ plan.

## 2012-02-03 ENCOUNTER — Other Ambulatory Visit: Payer: Self-pay | Admitting: *Deleted

## 2012-02-03 DIAGNOSIS — E7800 Pure hypercholesterolemia, unspecified: Secondary | ICD-10-CM

## 2012-02-03 NOTE — Telephone Encounter (Signed)
Patient called traige wanting crestor savings card and samples. Both placed up front, pt will have mother pick up today.

## 2012-02-04 ENCOUNTER — Other Ambulatory Visit: Payer: Self-pay | Admitting: *Deleted

## 2012-02-04 MED ORDER — ROSUVASTATIN CALCIUM 10 MG PO TABS: 10.0000 mg | ORAL_TABLET | Freq: Every day | ORAL | Status: DC

## 2012-03-13 ENCOUNTER — Other Ambulatory Visit: Payer: Self-pay

## 2012-04-01 ENCOUNTER — Encounter: Payer: Self-pay | Admitting: Lab

## 2012-04-02 ENCOUNTER — Ambulatory Visit: Payer: Commercial Managed Care - PPO | Admitting: Family Medicine

## 2012-04-15 ENCOUNTER — Encounter: Payer: Self-pay | Admitting: Lab

## 2012-04-16 ENCOUNTER — Encounter: Payer: Self-pay | Admitting: Family Medicine

## 2012-04-16 ENCOUNTER — Ambulatory Visit (INDEPENDENT_AMBULATORY_CARE_PROVIDER_SITE_OTHER): Payer: Commercial Managed Care - PPO | Admitting: Family Medicine

## 2012-04-16 VITALS — BP 118/80 | HR 87 | Temp 98.7°F | Ht 64.75 in | Wt 155.2 lb

## 2012-04-16 DIAGNOSIS — I10 Essential (primary) hypertension: Secondary | ICD-10-CM

## 2012-04-16 DIAGNOSIS — E785 Hyperlipidemia, unspecified: Secondary | ICD-10-CM

## 2012-04-16 LAB — BASIC METABOLIC PANEL
CO2: 29 mEq/L (ref 19–32)
GFR: 95.75 mL/min (ref >60.00)
Glucose, Bld: 108 mg/dL — ABNORMAL HIGH (ref 70–99)
Potassium: 3.5 mEq/L (ref 3.5–5.1)
Sodium: 141 mEq/L (ref 135–145)

## 2012-04-16 LAB — LIPID PANEL
Total CHOL/HDL Ratio: 3
VLDL: 11.4 mg/dL (ref 0.0–40.0)

## 2012-04-16 LAB — HEPATIC FUNCTION PANEL
AST: 27 U/L (ref 0–37)
Albumin: 3.8 g/dL (ref 3.5–5.2)
Alkaline Phosphatase: 65 U/L (ref 39–117)
Bilirubin, Direct: 0.1 mg/dL (ref 0.0–0.3)

## 2012-04-16 NOTE — Patient Instructions (Addendum)
Schedule your complete physical in 6 months You look GREAT!!  Keep up the good work!! [employer] notify you of your lab results and make any changes if needed Happy Bel[employer] Birthday!!!

## 2012-04-16 NOTE — Assessment & Plan Note (Signed)
Chronic problem.  Excellent control.  Asymptomatic.  Check labs.  Applauded weight loss efforts.  Will follow.

## 2012-04-16 NOTE — Progress Notes (Signed)
  Subjective:    Patient ID: Brandy May, female    DOB: 04/12/1955, 57 y.o.   MRN: [id number]  HPI HTN- chronic problem, excellent control on Norvasc.  Has recently been exercising and eating well.  Denies CP, SOB, HAs, visual changes, edema.  Hyperlipidemia- chronic problem, on Crestor.  Denies abd pain, N/V, myalgias.   Review of Systems For ROS see HPI     Objective:   Physical Exam  Vitals reviewed. Constitutional: She is oriented to person, place, and time. She appears well-developed and well-nourished. No distress.  HENT:  Head: Normocephalic and atraumatic.  Eyes: Conjunctivae and EOM are normal. Pupils are equal, round, and reactive to light.  Neck: Normal range of motion. Neck supple. No thyromegaly present.  Cardiovascular: Normal rate, regular rhythm, normal heart sounds and intact distal pulses.   No murmur heard. Pulmonary/Chest: Effort normal and breath sounds normal. No respiratory distress.  Abdominal: Soft. She exhibits no distension. There is no tenderness.  Musculoskeletal: She exhibits no edema.  Lymphadenopathy:    She has no cervical adenopathy.  Neurological: She is alert and oriented to person, place, and time.  Skin: Skin is warm and dry.  Psychiatric: She has a normal mood and affect. Her behavior is normal.          Assessment & Plan:

## 2012-04-16 NOTE — Assessment & Plan Note (Signed)
Chronic problem.  Tolerating statin w/out difficulty.  Check labs.  Adjust meds prn  

## 2012-05-06 ENCOUNTER — Encounter: Payer: Self-pay | Admitting: Family Medicine

## 2012-05-22 ENCOUNTER — Other Ambulatory Visit: Payer: Self-pay | Admitting: Family Medicine

## 2012-05-24 NOTE — Telephone Encounter (Signed)
Med filled.  

## 2012-06-04 ENCOUNTER — Telehealth: Payer: Self-pay | Admitting: Family Medicine

## 2012-06-04 NOTE — Telephone Encounter (Signed)
Ok for 4-6 weeks of samples if available

## 2012-06-04 NOTE — Telephone Encounter (Signed)
Pt called to see if she could get some samples of crestor. thanks

## 2012-06-04 NOTE — Telephone Encounter (Signed)
Spoke with the pt and informed her that Dr. Tawni Carnes the samples, pt stated she will pick the samples up on next Friday.  Will put samples up front.//AB/CMA

## 2012-06-11 ENCOUNTER — Encounter: Payer: Self-pay | Admitting: Family Medicine

## 2012-06-11 ENCOUNTER — Ambulatory Visit (INDEPENDENT_AMBULATORY_CARE_PROVIDER_SITE_OTHER): Payer: Commercial Managed Care - PPO | Admitting: Family Medicine

## 2012-06-11 VITALS — BP 128/80 | HR 105 | Temp 98.5°F | Ht 64.88 in | Wt 158.0 lb

## 2012-06-11 DIAGNOSIS — M25549 Pain in joints of unspecified hand: Secondary | ICD-10-CM | POA: Insufficient documentation

## 2012-06-11 DIAGNOSIS — M79643 Pain in unspecified hand: Secondary | ICD-10-CM

## 2012-06-11 LAB — CBC WITH DIFFERENTIAL/PLATELET
Eosinophils Relative: 2 % (ref 0–5)
HCT: 39.5 % (ref 36.0–46.0)
Lymphocytes Relative: 41 % (ref 12–46)
Lymphs Abs: 1.1 10*3/uL (ref 0.7–4.0)
MCV: 97.3 fL (ref 78.0–100.0)
Monocytes Absolute: 0.2 10*3/uL (ref 0.1–1.0)
RBC: 4.06 MIL/uL (ref 3.87–5.11)
WBC: 2.7 10*3/uL — ABNORMAL LOW (ref 4.0–10.5)

## 2012-06-11 NOTE — Patient Instructions (Addendum)
Take the Naproxen for the hand pain/stiffness We'll notify you of your lab results and decide on the next steps Call with any questions or concerns Have a great weekend!!!

## 2012-06-11 NOTE — Progress Notes (Signed)
  Subjective:    Patient ID: Brandy May, female    DOB: [birth year], 57 y.o.   MRN: [id number]  HPI Hand pain- 2 weeks ago developed discomfort in her fingers.  Some tightness, swelling, tingling in R hand that would improve as she moved them.  L hand developed similar sxs last week.  Pt is currently peri-menopausal.  No other joint pains 1st thing in AM.  No family hx of RA.  No change in diet.  No new meds.  No change in activity.   Review of Systems For ROS see HPI     Objective:   Physical Exam  Vitals reviewed. Constitutional: She appears well-developed and well-nourished. No distress.  Musculoskeletal: Normal range of motion. She exhibits tenderness (mild TTP over proximal and intermediate phlanges of hands bilaterally w/out swelling.  no TTP over knuckles). She exhibits no edema.          Assessment & Plan:

## 2012-06-13 NOTE — Assessment & Plan Note (Signed)
New.  Restart scheduled NSAIDs.  Check labs to r/o RA.  Pt to call if sxs don't improve.  Pt expressed understanding and is in agreement w/ plan.

## 2012-06-18 ENCOUNTER — Telehealth: Payer: Self-pay | Admitting: Family Medicine

## 2012-06-18 NOTE — Telephone Encounter (Signed)
Based on pain, needs referral to hand specialist.

## 2012-06-18 NOTE — Telephone Encounter (Signed)
Referral placed, and pt notified.

## 2012-06-18 NOTE — Telephone Encounter (Signed)
Patient is calling to inform Dr. Beverely Low that she is still with hand pain she was seen for on 06/11/12.  The Naproxen is not working at all to ease the pain.  Please advise.

## 2012-06-18 NOTE — Telephone Encounter (Signed)
Pt states that Pain mostly happens at nighttime, fingers joints begin to hurt especially the Rt thumb. Joints begin very stiff. Naproxen at bedtime has not helped pt still has stiffness in the am and pain. Please advise.

## 2012-08-02 ENCOUNTER — Other Ambulatory Visit: Payer: Self-pay | Admitting: *Deleted

## 2012-08-02 MED ORDER — AMLODIPINE BESYLATE 5 MG PO TABS: 5.0000 mg | ORAL_TABLET | Freq: Every day | ORAL | Status: DC

## 2012-08-02 NOTE — Telephone Encounter (Signed)
Rx sent to the pharmacy by e-script.  Also mailed letter informing the pt she is due a CPE,and to please call and schedule an appt.//AB/CMA

## 2012-08-29 ENCOUNTER — Other Ambulatory Visit: Payer: Self-pay | Admitting: Family Medicine

## 2012-09-27 LAB — HM PAP SMEAR
HM Pap smear: NORMAL
HM Pap smear: NORMAL

## 2012-09-27 LAB — HM MAMMOGRAPHY: HM Mammogram: NORMAL

## 2012-10-07 ENCOUNTER — Encounter: Payer: Self-pay | Admitting: Family Medicine

## 2012-10-07 ENCOUNTER — Ambulatory Visit (INDEPENDENT_AMBULATORY_CARE_PROVIDER_SITE_OTHER): Payer: Commercial Managed Care - PPO | Admitting: Family Medicine

## 2012-10-07 VITALS — BP 130/80 | HR 78 | Temp 98.1°F | Resp 16 | Ht 65.0 in | Wt 159.4 lb

## 2012-10-07 DIAGNOSIS — Z0000 Encounter for general adult medical examination without abnormal findings: Secondary | ICD-10-CM

## 2012-10-07 LAB — POCT URINALYSIS DIPSTICK
Bilirubin, UA: NEGATIVE
Blood, UA: NEGATIVE
Glucose, UA: NEGATIVE
Nitrite, UA: NEGATIVE
Spec Grav, UA: 1.015

## 2012-10-07 LAB — CBC WITH DIFFERENTIAL/PLATELET
Basophils Relative: 0.4 % (ref 0.0–3.0)
Eosinophils Absolute: 0.1 10*3/uL (ref 0.0–0.7)
Eosinophils Relative: 4.7 % (ref 0.0–5.0)
Hemoglobin: 13.6 g/dL (ref 12.0–15.0)
Lymphocytes Relative: 38.4 % (ref 12.0–46.0)
MCHC: 33.6 g/dL (ref 30.0–36.0)
Neutro Abs: 1.2 10*3/uL — ABNORMAL LOW (ref 1.4–7.7)
RBC: 4.2 Mil/uL (ref 3.87–5.11)
WBC: 2.5 10*3/uL — ABNORMAL LOW (ref 4.5–10.5)

## 2012-10-07 LAB — LIPID PANEL: Cholesterol: 159 mg/dL (ref 0–200)

## 2012-10-07 LAB — HEPATIC FUNCTION PANEL
ALT: 25 U/L (ref 0–35)
AST: 25 U/L (ref 0–37)
Albumin: 3.9 g/dL (ref 3.5–5.2)
Total Bilirubin: 0.9 mg/dL (ref 0.3–1.2)
Total Protein: 7.3 g/dL (ref 6.0–8.3)

## 2012-10-07 LAB — TSH: TSH: 0.64 u[IU]/mL (ref 0.35–5.50)

## 2012-10-07 LAB — BASIC METABOLIC PANEL
BUN: 15 mg/dL (ref 6–23)
CO2: 29 mEq/L (ref 19–32)
Chloride: 106 mEq/L (ref 96–112)
Creatinine, Ser: 0.8 mg/dL (ref 0.4–1.2)
Glucose, Bld: 97 mg/dL (ref 70–99)

## 2012-10-07 MED ORDER — NAPROXEN 500 MG PO TABS: 500.0000 mg | ORAL_TABLET | Freq: Two times a day (BID) | ORAL | Status: AC

## 2012-10-07 NOTE — Progress Notes (Signed)
  Subjective:    Patient ID: Brandy May, female    DOB: December 23, 1955, 57 y.o.   MRN: [id number]  HPI CPE- UTD on GYN (October), mammo.  Had colonoscopy 2009- [facility].  No concerns today   Review of Systems Patient reports no vision/ hearing changes, adenopathy,fever, weight change,  persistant/recurrent hoarseness , swallowing issues, chest pain, palpitations, edema, persistant/recurrent cough, hemoptysis, dyspnea (rest/exertional/paroxysmal nocturnal), gastrointestinal bleeding (melena, rectal bleeding), abdominal pain, significant heartburn, bowel changes, GU symptoms (dysuria, hematuria, incontinence), Gyn symptoms (abnormal  bleeding, pain),  syncope, focal weakness, memory loss, numbness & tingling, skin/hair/nail changes, abnormal bruising or bleeding, anxiety, or depression.     Objective:   Physical Exam General Appearance:    Alert, cooperative, no distress, appears stated age  Head:    Normocephalic, without obvious abnormality, atraumatic  Eyes:    PERRL, conjunctiva/corneas clear, EOM's intact, fundi    benign, both eyes  Ears:    Normal TM's and external ear canals, both ears  Nose:   Nares normal, septum midline, mucosa normal, no drainage    or sinus tenderness  Throat:   Lips, mucosa, and tongue normal; teeth and gums normal  Neck:   Supple, symmetrical, trachea midline, no adenopathy;    Thyroid: no enlargement/tenderness/nodules  Back:     Symmetric, no curvature, ROM normal, no CVA tenderness  Lungs:     Clear to auscultation bilaterally, respirations unlabored  Chest Wall:    No tenderness or deformity   Heart:    Regular rate and rhythm, S1 and S2 normal, no murmur, rub   or gallop  Breast Exam:    Deferred to GYN  Abdomen:     Soft, non-tender, bowel sounds active all four quadrants,    no masses, no organomegaly  Genitalia:    Deferred to GYN  Rectal:    Extremities:   Extremities normal, atraumatic, no cyanosis or edema  Pulses:   2+ and symmetric  all extremities  Skin:   Skin color, texture, turgor normal, no rashes or lesions  Lymph nodes:   Cervical, supraclavicular, and axillary nodes normal  Neurologic:   CNII-XII intact, normal strength, sensation and reflexes    throughout          Assessment & Plan:

## 2012-10-07 NOTE — Assessment & Plan Note (Signed)
Pt's PE WNL.  UTD on GYN, colonoscopy.  Check labs.  Anticipatory guidance provided.  

## 2012-10-07 NOTE — Patient Instructions (Addendum)
Follow up in 6 months to recheck BP and cholesterol Keep up the good work!  You look great! We'll notify you of your lab results and make any changes if needed Call with any questions or concerns Happy Fall!!

## 2012-10-08 ENCOUNTER — Other Ambulatory Visit: Payer: Self-pay | Admitting: Family Medicine

## 2012-10-08 ENCOUNTER — Encounter: Payer: Commercial Managed Care - PPO | Admitting: Family Medicine

## 2012-10-08 DIAGNOSIS — D72819 Decreased white blood cell count, unspecified: Secondary | ICD-10-CM

## 2012-10-11 ENCOUNTER — Other Ambulatory Visit: Payer: Self-pay | Admitting: Family Medicine

## 2012-10-11 NOTE — Telephone Encounter (Signed)
Rx filled for 6 months. Pt last seen on 10/07/2012 with instructions to follow up in 6 months. SW, CMA

## 2012-10-13 LAB — VITAMIN D 1,25 DIHYDROXY
Vitamin D 1, 25 (OH)2 Total: 62 pg/mL (ref 18–72)
Vitamin D2 1, 25 (OH)2: <8 pg/mL
Vitamin D3 1, 25 (OH)2: 62 pg/mL

## 2012-10-25 ENCOUNTER — Other Ambulatory Visit: Payer: Self-pay

## 2012-10-25 DIAGNOSIS — Z1231 Encounter for screening mammogram for malignant neoplasm of breast: Secondary | ICD-10-CM

## 2012-11-02 ENCOUNTER — Ambulatory Visit
Admission: RE | Admit: 2012-11-02 | Discharge: 2012-11-02 | Disposition: A | Discharge status: Home / Self Care | Payer: Commercial Managed Care - PPO | Source: Ambulatory Visit

## 2012-11-02 DIAGNOSIS — Z1231 Encounter for screening mammogram for malignant neoplasm of breast: Secondary | ICD-10-CM

## 2012-11-26 ENCOUNTER — Telehealth: Payer: Self-pay | Admitting: Hematology & Oncology

## 2012-11-26 NOTE — Telephone Encounter (Signed)
Left vm w NEW PATIENT today to remind them of their appointment with Dr. Ennever. Also, advised them to bring all meds and insurance information. ° °

## 2012-11-29 ENCOUNTER — Ambulatory Visit: Payer: Commercial Managed Care - PPO

## 2012-11-29 ENCOUNTER — Ambulatory Visit (HOSPITAL_BASED_OUTPATIENT_CLINIC_OR_DEPARTMENT_OTHER): Payer: Commercial Managed Care - PPO | Admitting: Lab

## 2012-11-29 ENCOUNTER — Ambulatory Visit (HOSPITAL_BASED_OUTPATIENT_CLINIC_OR_DEPARTMENT_OTHER): Payer: Commercial Managed Care - PPO | Admitting: Hematology & Oncology

## 2012-11-29 DIAGNOSIS — D72819 Decreased white blood cell count, unspecified: Secondary | ICD-10-CM

## 2012-11-29 DIAGNOSIS — E538 Deficiency of other specified B group vitamins: Secondary | ICD-10-CM

## 2012-11-29 LAB — CBC WITH DIFFERENTIAL (CANCER CENTER ONLY)
BASO%: 0.7 % (ref 0.0–2.0)
LYMPH%: 38.6 % (ref 14.0–48.0)
MCH: 32.5 pg (ref 26.0–34.0)
MCHC: 32.3 g/dL (ref 32.0–36.0)
MCV: 101 fL (ref 81–101)
MONO#: 0.2 10*3/uL (ref 0.1–0.9)
MONO%: 7.5 % (ref 0.0–13.0)
NEUT#: 1.5 10*3/uL (ref 1.5–6.5)
Platelets: 197 10*3/uL (ref 145–400)
RBC: 4.03 10*6/uL (ref 3.70–5.32)
RDW: 11.5 % (ref 11.1–15.7)
WBC: 2.9 10*3/uL — ABNORMAL LOW (ref 3.9–10.0)

## 2012-11-29 LAB — CHCC SATELLITE - SMEAR

## 2012-11-29 NOTE — Progress Notes (Signed)
This office note has been dictated.

## 2012-11-30 LAB — VITAMIN B12: Vitamin B-12: 451 pg/mL (ref 211–911)

## 2012-11-30 LAB — RHEUMATOID FACTOR: Rhuematoid fact SerPl-aCnc: <10 IU/mL (ref <=14)

## 2012-11-30 NOTE — Progress Notes (Signed)
CC:   Brandy May, M.D.  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  HISTORY OF PRESENT ILLNESS:  Brandy May is a very charming 57 year old African American female.  She is of [place] heritage.  She was originally born in [place], but moved to [place].  She actually works for a Designer, multimedia, I think, [employer].  She is followed by Dr. Beverely Low.  Dr. Beverely Low has noted chronic low white cells.  Going through the medical record, back in March of 2013, her white cell count was 3.6, hemoglobin 14, hematocrit 42.  At that time, her MCV was on the high side.  Her platelets were 209.  She had normal white cell differential.  There is some mention of her having B12 deficiency.  She was checked for thyroid.  This is okay.  She had normal electrolytes.  Liver tests were okay.  She continued to have a low level of white cells.  In May of this year, her white cell count was 2.7, hemoglobin 12.9, hematocrit 39.5, platelet count 212,000.  MCV was 97.  Most recently, in September, her white cell count was 2.5, hemoglobin 13.6, hematocrit 40.6, and platelet count was 185.  She was kindly referred to the [facility] for an evaluation.  Apparently, she has had no symptoms.  She says that she feels well.  She has had no problem with infections.  There has been no rash.  She has had no joint issues.  There has been no weight loss or weight gain.  She is not a vegetarian.  With her job, she is not exposed to any kind of industrial solvents.  There are no obvious blood problems in her family as far as she knows.  Again, she has had no issues with joints.  There has been no rashes. There has been no swollen lymph glands.  She has had no headache.  There has been no hair loss.  She has had no cough or shortness of breath. There has been no change in bowel or bladder habits.  She stays current with her preventive studies.  Her mammogram was just done in  October, and everything looked okay.  She actually had a bone marrow test done back in 2005.  The bone marrow report ([id number]) showed normal cellular marrow with trilineage hematopoiesis.  There was no obvious marrow abnormality.  She had flow cytometry done, which was also unremarkable.  Iron stores were slightly decreased.  I am sure cytogenetics were sent on this.  I am sure we probably will not be able to find these.  PAST MEDICAL HISTORY:  Remarkable for: 1. Hypertension. 2. Hyperlipidemia. 3. History of DVT. 4. B12 deficiency.  ALLERGIES:  Coumadin.  MEDICATIONS:  Norvasc 5 mg p.o. q. day, Crestor 10 mg p.o. q. day, Flexeril 10 mg p.o. t.i.d. p.r.n., and Naprosyn 500 mg p.o. b.i.d.  SOCIAL HISTORY:  Negative for tobacco use.  There is no alcohol use. She has no obvious occupational exposures.  FAMILY HISTORY:  Noncontributory.  REVIEW OF SYSTEMS:  As stated in the history of present illness.  No additional findings noted on a 12-system review.  PHYSICAL EXAMINATION:  General:  This is a well-developed, well- nourished African American female in no obvious distress.  Vital Signs: Temperature of 98, pulse 82, respiratory rate 18, blood pressure 140/70. Weight is 159.  Head and Neck: She has a normocephalic, atraumatic skull.  There are no ocular or oral lesions.  She has no palpable cervical or  supraclavicular lymph nodes.  Lungs:  Clear to percussion and auscultation bilaterally.  Cardiac:  Regular rate and rhythm with a normal S1, S2.  There are no murmurs, rubs, or bruits.  Abdomen:  Soft. She has good bowel sounds.  There is no fluid wave.  There is no palpable abdominal mass.  There is no palpable hepatosplenomegaly. Back:  No tenderness over the spine, ribs, or hips.  Extremities:  No clubbing, cyanosis, or edema.  She has good range of motion of her joints.  She has no joint warmth, erythema, or weakness.  Neurological: No focal neurological deficits.  Skin:   No rashes, ecchymoses, or petechiae.  LABORATORY STUDIES:  White cell count is 2.9, hemoglobin 13.1, hematocrit 40.6, platelet count 197.  MCV is 101.  White cell differential shows 50 segs, 39 lymphs, 7 monos.  Peripheral smear shows a normochromic, normocytic population of red blood cells.  There are no nucleated red blood cells.  I see no teardrop cells.  She has no rouleaux formation.  I see no target cells.  She has no inclusion bodies.  White cells are somewhat decreased in number. There are no hypersegmented polys.  There are no immature myeloid forms. She has no blasts.  There are no atypical lymphocytes.  Platelets are adequate in number and size.  IMPRESSION:  Brandy May is a very charming 57 year old African American female.  She has chronic leukopenia.  She actually had a bone marrow test 10 years ago.  I do not see anything on her physical exam or with her peripheral smear that shows an underlying marrow disorder.  I am not sure what the B12 deficiency is all about.  I did send off a B12 level on her.  I have to believe that she has ethnic associated leukopenia.  This would make the most sense clinically, as she has been asymptomatic with this leukopenia.  I could not imagine her having a collagen vascular disease.  Again, I think she was checked for this a while back.  She is totally asymptomatic.  I do not see that we need to do anything invasive to her.  I do not see a need for any kind of scans or other x- rays.  I spent a good hour with Brandy May.  I explained to her what I felt she had.  She said that she has always had low white cells and has been told that this is what she lives with.  I reassured her that I did not see that this was going to cause her any problems down the road.  I do not think we have to get Brandy May back.  She is very nice.  I had a nice time talking with her.  I just do not think that we are going to add to her medical  care.    ______________________________ Josph Macho, M.D. PRE/MEDQ  D:  11/29/2012  T:  11/30/2012  Job:  [id number]

## 2012-12-01 LAB — LUPUS ANTICOAGULANT PANEL
DRVVT: 37.4 secs (ref <42.9)
PTT Lupus Anticoagulant: 33.5 secs (ref 28.0–43.0)

## 2013-01-02 ENCOUNTER — Other Ambulatory Visit: Payer: Self-pay | Admitting: Family Medicine

## 2013-01-03 NOTE — Telephone Encounter (Signed)
Patient called to check on the status of her refill for crestor. She states that her pharmacy have tried numerous times to obtain a refill.

## 2013-01-04 NOTE — Telephone Encounter (Signed)
Rx sent to the pharmacy by e-script.//AB/CMA 

## 2013-04-08 ENCOUNTER — Ambulatory Visit (INDEPENDENT_AMBULATORY_CARE_PROVIDER_SITE_OTHER): Payer: Commercial Managed Care - PPO | Admitting: Family Medicine

## 2013-04-08 ENCOUNTER — Encounter: Payer: Self-pay | Admitting: Family Medicine

## 2013-04-08 ENCOUNTER — Ambulatory Visit: Payer: Commercial Managed Care - PPO | Admitting: Family Medicine

## 2013-04-08 VITALS — BP 122/78 | HR 62 | Temp 98.4°F | Resp 16 | Wt 162.4 lb

## 2013-04-08 DIAGNOSIS — I10 Essential (primary) hypertension: Secondary | ICD-10-CM

## 2013-04-08 DIAGNOSIS — E785 Hyperlipidemia, unspecified: Secondary | ICD-10-CM

## 2013-04-08 LAB — BASIC METABOLIC PANEL
BUN: 10 mg/dL (ref 6–23)
CO2: 28 mEq/L (ref 19–32)
CREATININE: 0.7 mg/dL (ref 0.4–1.2)
Calcium: 9.6 mg/dL (ref 8.4–10.5)
Chloride: 103 mEq/L (ref 96–112)
GFR: 121.23 mL/min (ref >60.00)
Glucose, Bld: 75 mg/dL (ref 70–99)
Potassium: 3.4 mEq/L — ABNORMAL LOW (ref 3.5–5.1)
Sodium: 140 mEq/L (ref 135–145)

## 2013-04-08 LAB — LIPID PANEL
CHOL/HDL RATIO: 3
Cholesterol: 165 mg/dL (ref 0–200)
HDL: 57.3 mg/dL (ref >39.00)
LDL CALC: 96 mg/dL (ref 0–99)
TRIGLYCERIDES: 57 mg/dL (ref 0.0–149.0)
VLDL: 11.4 mg/dL (ref 0.0–40.0)

## 2013-04-08 LAB — HEPATIC FUNCTION PANEL
ALT: 31 U/L (ref 0–35)
AST: 23 U/L (ref 0–37)
Albumin: 4.4 g/dL (ref 3.5–5.2)
Alkaline Phosphatase: 83 U/L (ref 39–117)
BILIRUBIN TOTAL: 0.7 mg/dL (ref 0.3–1.2)
Bilirubin, Direct: 0.1 mg/dL (ref 0.0–0.3)
Total Protein: 8 g/dL (ref 6.0–8.3)

## 2013-04-08 NOTE — Progress Notes (Signed)
Pre visit review using our clinic review tool, if applicable. No additional management support is needed unless otherwise documented below in the visit note. 

## 2013-04-08 NOTE — Assessment & Plan Note (Signed)
Chronic problem.  Tolerating statin w/o difficulty.  Check labs.  Adjust meds prn  

## 2013-04-08 NOTE — Patient Instructions (Signed)
Schedule your complete physical in 6 months We'll notify you of your lab results and make any changes if needed Keep up the good work!  You look great! Call with any questions or concerns Happy Spring!!! 

## 2013-04-08 NOTE — Progress Notes (Signed)
   Subjective:    Patient ID: Brandy May, female    DOB: [birth year], 58 y.o.   MRN: [id number]  HPI HTN- chronic problem, well controlled on Amlodipine.  No CP, SOB, HAs, visual changes, edema.  Hyperlipidemia- chronic problem, on Crestor.  No abd pain, N/V, myalgias.   Review of Systems For ROS see HPI     Objective:   Physical Exam  Vitals reviewed. Constitutional: She is oriented to person, place, and time. She appears well-developed and well-nourished. No distress.  HENT:  Head: Normocephalic and atraumatic.  Eyes: Conjunctivae and EOM are normal. Pupils are equal, round, and reactive to light.  Neck: Normal range of motion. Neck supple. Thyromegaly (multi-nodular goiter) present.  Cardiovascular: Normal rate, regular rhythm, normal heart sounds and intact distal pulses.   No murmur heard. Pulmonary/Chest: Effort normal and breath sounds normal. No respiratory distress.  Abdominal: Soft. She exhibits no distension. There is no tenderness.  Musculoskeletal: She exhibits no edema.  Lymphadenopathy:    She has no cervical adenopathy.  Neurological: She is alert and oriented to person, place, and time.  Skin: Skin is warm and dry.  Psychiatric: She has a normal mood and affect. Her behavior is normal.          Assessment & Plan:

## 2013-04-08 NOTE — Assessment & Plan Note (Signed)
Chronic problem.  Well controlled.  Asymptomatic.  No med changes.  Will follow. 

## 2013-04-11 ENCOUNTER — Telehealth: Payer: Self-pay | Admitting: Family Medicine

## 2013-04-11 NOTE — Telephone Encounter (Signed)
Relevant patient education assigned to patient using Emmi. ° °

## 2013-05-03 ENCOUNTER — Other Ambulatory Visit: Payer: Self-pay | Admitting: Family Medicine

## 2013-05-03 NOTE — Telephone Encounter (Signed)
Med filled.  

## 2013-08-17 ENCOUNTER — Telehealth: Payer: Self-pay | Admitting: Family Medicine

## 2013-08-17 MED ORDER — ROSUVASTATIN CALCIUM 10 MG PO TABS: ORAL_TABLET | ORAL | Status: DC

## 2013-08-17 NOTE — Telephone Encounter (Signed)
Med filled.  

## 2013-08-17 NOTE — Telephone Encounter (Signed)
Caller name: [place]: [facility]  Reason for call: CRESTOR 10 MG tablet refill to [facility], not [facility].  The script is needing the directions as well.

## 2013-08-19 ENCOUNTER — Telehealth: Payer: Self-pay | Admitting: Family Medicine

## 2013-08-19 MED ORDER — ROSUVASTATIN CALCIUM 10 MG PO TABS: ORAL_TABLET | ORAL | Status: DC

## 2013-08-19 NOTE — Telephone Encounter (Signed)
[facility] states that [facility] does not have refill for Crestor. Please advise.

## 2013-08-19 NOTE — Telephone Encounter (Signed)
Med filled.  

## 2013-10-08 ENCOUNTER — Other Ambulatory Visit: Payer: Self-pay | Admitting: Family Medicine

## 2013-10-10 ENCOUNTER — Encounter: Payer: Self-pay | Admitting: Family Medicine

## 2013-10-10 ENCOUNTER — Ambulatory Visit (INDEPENDENT_AMBULATORY_CARE_PROVIDER_SITE_OTHER): Payer: Commercial Managed Care - PPO | Admitting: Family Medicine

## 2013-10-10 VITALS — BP 150/90 | HR 88 | Temp 98.0°F | Resp 16 | Ht 66.0 in | Wt 158.0 lb

## 2013-10-10 DIAGNOSIS — Z0000 Encounter for general adult medical examination without abnormal findings: Secondary | ICD-10-CM

## 2013-10-10 LAB — CBC WITH DIFFERENTIAL/PLATELET
BASOS ABS: 0 10*3/uL (ref 0.0–0.1)
BASOS PCT: 0.6 % (ref 0.0–3.0)
EOS PCT: 2.3 % (ref 0.0–5.0)
Eosinophils Absolute: 0.1 10*3/uL (ref 0.0–0.7)
HEMATOCRIT: 40.7 % (ref 36.0–46.0)
HEMOGLOBIN: 13.4 g/dL (ref 12.0–15.0)
LYMPHS ABS: 1.2 10*3/uL (ref 0.7–4.0)
LYMPHS PCT: 38.9 % (ref 12.0–46.0)
MCHC: 32.9 g/dL (ref 30.0–36.0)
MCV: 99.2 fl (ref 78.0–100.0)
Monocytes Absolute: 0.2 10*3/uL (ref 0.1–1.0)
Monocytes Relative: 6.8 % (ref 3.0–12.0)
NEUTROS ABS: 1.6 10*3/uL (ref 1.4–7.7)
Neutrophils Relative %: 51.4 % (ref 43.0–77.0)
Platelets: 187 10*3/uL (ref 150.0–400.0)
RBC: 4.11 Mil/uL (ref 3.87–5.11)
RDW: 13.4 % (ref 11.5–15.5)
WBC: 3.2 10*3/uL — AB (ref 4.0–10.5)

## 2013-10-10 LAB — POCT URINALYSIS DIPSTICK
BILIRUBIN UA: NEGATIVE
GLUCOSE UA: NEGATIVE
KETONES UA: NEGATIVE
Leukocytes, UA: NEGATIVE
Nitrite, UA: NEGATIVE
Protein, UA: 0.15
RBC UA: NEGATIVE
Spec Grav, UA: >=1.03
UROBILINOGEN UA: 0.2
pH, UA: 5.5

## 2013-10-10 LAB — HEPATIC FUNCTION PANEL
ALK PHOS: 70 U/L (ref 39–117)
ALT: 15 U/L (ref 0–35)
AST: 21 U/L (ref 0–37)
Albumin: 4 g/dL (ref 3.5–5.2)
Bilirubin, Direct: 0.1 mg/dL (ref 0.0–0.3)
Total Bilirubin: 0.9 mg/dL (ref 0.2–1.2)
Total Protein: 7.9 g/dL (ref 6.0–8.3)

## 2013-10-10 LAB — LIPID PANEL
CHOL/HDL RATIO: 3
CHOLESTEROL: 155 mg/dL (ref 0–200)
HDL: 48 mg/dL (ref >39.00)
LDL Cholesterol: 99 mg/dL (ref 0–99)
NonHDL: 107
TRIGLYCERIDES: 41 mg/dL (ref 0.0–149.0)
VLDL: 8.2 mg/dL (ref 0.0–40.0)

## 2013-10-10 LAB — BASIC METABOLIC PANEL
BUN: 15 mg/dL (ref 6–23)
CO2: 29 mEq/L (ref 19–32)
Calcium: 9.2 mg/dL (ref 8.4–10.5)
Chloride: 106 mEq/L (ref 96–112)
Creatinine, Ser: 0.8 mg/dL (ref 0.4–1.2)
GFR: 95.23 mL/min (ref >60.00)
Glucose, Bld: 87 mg/dL (ref 70–99)
POTASSIUM: 3.7 meq/L (ref 3.5–5.1)
SODIUM: 142 meq/L (ref 135–145)

## 2013-10-10 LAB — TSH: TSH: 0.87 u[IU]/mL (ref 0.35–4.50)

## 2013-10-10 LAB — VITAMIN D 25 HYDROXY (VIT D DEFICIENCY, FRACTURES): VITD: 32.37 ng/mL (ref 30.00–100.00)

## 2013-10-10 NOTE — Telephone Encounter (Signed)
Med filled.  

## 2013-10-10 NOTE — Patient Instructions (Signed)
Follow up in 6 months to recheck BP and cholesterol We'll notify you of your lab results and make any changes if needed Schedule your mammo in October when you are due Call with any questions or concerns Keep up the good work!  You look great!

## 2013-10-10 NOTE — Assessment & Plan Note (Signed)
Pt's PE WNL.  UTD on health maintenance.  Check labs.  Anticipatory guidance provided.  

## 2013-10-10 NOTE — Progress Notes (Signed)
Pre visit review using our clinic review tool, if applicable. No additional management support is needed unless otherwise documented below in the visit note. 

## 2013-10-10 NOTE — Progress Notes (Signed)
   Subjective:    Patient ID: Brandy May, female    DOB: 1955/10/04, 58 y.o.   MRN: [id number]  HPI CPE- UTD on mammo (due October), pap [facility]), colonoscopy.   Review of Systems Patient reports no vision/ hearing changes, adenopathy,fever, weight change,  persistant/recurrent hoarseness , swallowing issues, chest pain, palpitations, edema, persistant/recurrent cough, hemoptysis, dyspnea (rest/exertional/paroxysmal nocturnal), gastrointestinal bleeding (melena, rectal bleeding), abdominal pain, significant heartburn, bowel changes, GU symptoms (dysuria, hematuria, incontinence), Gyn symptoms (abnormal  bleeding, pain),  syncope, focal weakness, memory loss, numbness & tingling, skin/hair/nail changes, abnormal bruising or bleeding, anxiety, or depression.     Objective:   Physical Exam  General Appearance:    Alert, cooperative, no distress, appears stated age  Head:    Normocephalic, without obvious abnormality, atraumatic  Eyes:    PERRL, conjunctiva/corneas clear, EOM's intact, fundi    benign, both eyes  Ears:    Normal TM's and external ear canals, both ears  Nose:   Nares normal, septum midline, mucosa normal, no drainage    or sinus tenderness  Throat:   Lips, mucosa, and tongue normal; teeth and gums normal  Neck:   Supple, symmetrical, trachea midline, no adenopathy;    Thyroid: mild diffuse enlargement, consistent w/ multi-nodular goiter  Back:     Symmetric, no curvature, ROM normal, no CVA tenderness  Lungs:     Clear to auscultation bilaterally, respirations unlabored  Chest Wall:    No tenderness or deformity   Heart:    Regular rate and rhythm, S1 and S2 normal, no murmur, rub   or gallop  Breast Exam:    No tenderness, masses, or nipple abnormality  Abdomen:     Soft, non-tender, bowel sounds active all four quadrants,    no masses, no organomegaly  Genitalia:    External genitalia normal, cervix normal in appearance, no CMT, uterus in normal size and position,  adnexa w/out mass or tenderness, mucosa pink and moist, no lesions or discharge present  Rectal:    Normal external appearance  Extremities:   Extremities normal, atraumatic, no cyanosis or edema  Pulses:   2+ and symmetric all extremities  Skin:   Skin color, texture, turgor normal, no rashes or lesions  Lymph nodes:   Cervical, supraclavicular, and axillary nodes normal  Neurologic:   CNII-XII intact, normal strength, sensation and reflexes    throughout          Assessment & Plan:

## 2013-10-24 ENCOUNTER — Other Ambulatory Visit: Payer: Self-pay

## 2013-10-24 DIAGNOSIS — Z1231 Encounter for screening mammogram for malignant neoplasm of breast: Secondary | ICD-10-CM

## 2013-11-07 ENCOUNTER — Ambulatory Visit
Admission: RE | Admit: 2013-11-07 | Discharge: 2013-11-07 | Disposition: A | Discharge status: Home / Self Care | Payer: Commercial Managed Care - PPO | Source: Ambulatory Visit

## 2013-11-07 ENCOUNTER — Ambulatory Visit (INDEPENDENT_AMBULATORY_CARE_PROVIDER_SITE_OTHER): Payer: Commercial Managed Care - PPO

## 2013-11-07 DIAGNOSIS — Z1231 Encounter for screening mammogram for malignant neoplasm of breast: Secondary | ICD-10-CM

## 2013-11-07 DIAGNOSIS — Z23 Encounter for immunization: Secondary | ICD-10-CM

## 2013-12-27 ENCOUNTER — Other Ambulatory Visit: Payer: Self-pay

## 2013-12-27 MED ORDER — ROSUVASTATIN CALCIUM 10 MG PO TABS: ORAL_TABLET | ORAL | Status: DC

## 2013-12-27 NOTE — Telephone Encounter (Signed)
Crestor refilled per protocol and sent to [facility]. JG//CMA

## 2013-12-27 NOTE — Telephone Encounter (Signed)
Brandy May [phone] [facility]  [name] called to say that [facility] said that had contacted [place] about a refill on her rosuvastatin (CRESTOR) 10 MG tablet and they have not heard back from [place] yet.

## 2014-01-27 DIAGNOSIS — Z87442 Personal history of urinary calculi: Secondary | ICD-10-CM

## 2014-01-27 HISTORY — DX: Personal history of urinary calculi: Z87.442

## 2014-04-12 ENCOUNTER — Ambulatory Visit (INDEPENDENT_AMBULATORY_CARE_PROVIDER_SITE_OTHER): Payer: Commercial Managed Care - PPO | Admitting: Family Medicine

## 2014-04-12 ENCOUNTER — Encounter: Payer: Self-pay | Admitting: Family Medicine

## 2014-04-12 VITALS — BP 120/80 | HR 80 | Temp 98.2°F | Resp 16 | Wt 161.1 lb

## 2014-04-12 DIAGNOSIS — E785 Hyperlipidemia, unspecified: Secondary | ICD-10-CM

## 2014-04-12 DIAGNOSIS — I10 Essential (primary) hypertension: Secondary | ICD-10-CM

## 2014-04-12 LAB — CBC WITH DIFFERENTIAL/PLATELET
BASOS ABS: 0 10*3/uL (ref 0.0–0.1)
BASOS PCT: 0.6 % (ref 0.0–3.0)
EOS ABS: 0.1 10*3/uL (ref 0.0–0.7)
Eosinophils Relative: 2.5 % (ref 0.0–5.0)
HEMATOCRIT: 39.9 % (ref 36.0–46.0)
HEMOGLOBIN: 13.5 g/dL (ref 12.0–15.0)
LYMPHS ABS: 1.2 10*3/uL (ref 0.7–4.0)
Lymphocytes Relative: 47.9 % — ABNORMAL HIGH (ref 12.0–46.0)
MCHC: 33.8 g/dL (ref 30.0–36.0)
MCV: 96.1 fl (ref 78.0–100.0)
Monocytes Absolute: 0.2 10*3/uL (ref 0.1–1.0)
Monocytes Relative: 6.8 % (ref 3.0–12.0)
Neutro Abs: 1.1 10*3/uL — ABNORMAL LOW (ref 1.4–7.7)
Neutrophils Relative %: 42.2 % — ABNORMAL LOW (ref 43.0–77.0)
Platelets: 190 10*3/uL (ref 150.0–400.0)
RBC: 4.15 Mil/uL (ref 3.87–5.11)
RDW: 13.3 % (ref 11.5–15.5)
WBC: 2.6 10*3/uL — ABNORMAL LOW (ref 4.0–10.5)

## 2014-04-12 LAB — HEPATIC FUNCTION PANEL
ALT: 13 U/L (ref 0–35)
AST: 18 U/L (ref 0–37)
Albumin: 4.4 g/dL (ref 3.5–5.2)
Alkaline Phosphatase: 66 U/L (ref 39–117)
BILIRUBIN TOTAL: 0.7 mg/dL (ref 0.2–1.2)
Bilirubin, Direct: 0.2 mg/dL (ref 0.0–0.3)
TOTAL PROTEIN: 7.5 g/dL (ref 6.0–8.3)

## 2014-04-12 LAB — LIPID PANEL
Cholesterol: 166 mg/dL (ref 0–200)
HDL: 57 mg/dL (ref >39.00)
LDL CALC: 100 mg/dL — AB (ref 0–99)
NonHDL: 109
Total CHOL/HDL Ratio: 3
Triglycerides: 44 mg/dL (ref 0.0–149.0)
VLDL: 8.8 mg/dL (ref 0.0–40.0)

## 2014-04-12 LAB — BASIC METABOLIC PANEL
BUN: 17 mg/dL (ref 6–23)
CHLORIDE: 105 meq/L (ref 96–112)
CO2: 30 meq/L (ref 19–32)
Calcium: 9.5 mg/dL (ref 8.4–10.5)
Creatinine, Ser: 0.83 mg/dL (ref 0.40–1.20)
GFR: 91.1 mL/min (ref >60.00)
Glucose, Bld: 82 mg/dL (ref 70–99)
POTASSIUM: 3.9 meq/L (ref 3.5–5.1)
SODIUM: 139 meq/L (ref 135–145)

## 2014-04-12 MED ORDER — AMLODIPINE BESYLATE 5 MG PO TABS: 5.0000 mg | ORAL_TABLET | Freq: Every day | ORAL | Status: DC

## 2014-04-12 MED ORDER — ROSUVASTATIN CALCIUM 10 MG PO TABS: ORAL_TABLET | ORAL | Status: DC

## 2014-04-12 NOTE — Patient Instructions (Signed)
Schedule your complete physical in 6 months (after 9/14) We'll notify you of your lab results and make any changes if needed Keep up the good work!  You look great! Call with any questions or concerns Happy Spring!!!

## 2014-04-12 NOTE — Assessment & Plan Note (Signed)
Chronic problem.  Tolerating statin w/o difficulty.  Check labs.  Adjust meds prn  

## 2014-04-12 NOTE — Progress Notes (Signed)
Pre visit review using our clinic review tool, if applicable. No additional management support is needed unless otherwise documented below in the visit note. 

## 2014-04-12 NOTE — Progress Notes (Signed)
   Subjective:    Patient ID: Brandy May, female    DOB: [birth year], 57 y.o.   MRN: [id number]  HPI HTN- chronic problem, on Amlodipine.  Good med compliance.  No CP, SOB, HAs, visual changes, edema.  Exercising intermittently.  Eating healthy diet.  Hyperlipidemia- chronic problem, on Crestor.  No abd pain, N/V, myalgias.     Review of Systems For ROS see HPI     Objective:   Physical Exam  Constitutional: She is oriented to person, place, and time. She appears well-developed and well-nourished. No distress.  HENT:  Head: Normocephalic and atraumatic.  Eyes: Conjunctivae and EOM are normal. Pupils are equal, round, and reactive to light.  Neck: Normal range of motion. Neck supple. No thyromegaly present.  Cardiovascular: Normal rate, regular rhythm, normal heart sounds and intact distal pulses.   No murmur heard. Pulmonary/Chest: Effort normal and breath sounds normal. No respiratory distress.  Abdominal: Soft. She exhibits no distension. There is no tenderness.  Musculoskeletal: She exhibits no edema.  Lymphadenopathy:    She has no cervical adenopathy.  Neurological: She is alert and oriented to person, place, and time.  Skin: Skin is warm and dry.  Psychiatric: She has a normal mood and affect. Her behavior is normal.  Vitals reviewed.         Assessment & Plan:

## 2014-04-12 NOTE — Assessment & Plan Note (Signed)
Chronic problem, well controlled on Amlodipine.  Refills sent.  No med changes at this time.

## 2014-07-17 ENCOUNTER — Ambulatory Visit (INDEPENDENT_AMBULATORY_CARE_PROVIDER_SITE_OTHER): Payer: Commercial Managed Care - PPO | Admitting: Physician Assistant

## 2014-07-17 ENCOUNTER — Encounter: Payer: Self-pay | Admitting: Physician Assistant

## 2014-07-17 VITALS — BP 147/80 | HR 83 | Temp 98.1°F | Resp 16 | Ht 66.0 in | Wt 166.5 lb

## 2014-07-17 DIAGNOSIS — M545 Low back pain, unspecified: Secondary | ICD-10-CM | POA: Insufficient documentation

## 2014-07-17 MED ORDER — TRAMADOL HCL 50 MG PO TABS: 50.0000 mg | ORAL_TABLET | Freq: Three times a day (TID) | ORAL | Status: DC | PRN

## 2014-07-17 MED ORDER — METHOCARBAMOL 500 MG PO TABS: 500.0000 mg | ORAL_TABLET | Freq: Three times a day (TID) | ORAL | Status: DC | PRN

## 2014-07-17 NOTE — Progress Notes (Signed)
Pre visit review using our clinic review tool, if applicable. No additional management support is needed unless otherwise documented below in the visit note/SLS  

## 2014-07-17 NOTE — Progress Notes (Signed)
   Patient presents to clinic today c/o bilateral low back pain x 1 week first noted after getting out of car after a long car ride.  Pain is described as only being present after prolonged sitting. Walking and lying down alleviate symptoms. Patient was seen at [facility] and was told symptoms were likely muscular in nature.  Patient was given Meloxicam twice daily as directed and Flexeril at bedtime.  Endorses mild improvement with Meloxicam but Flexeril is too sedating for her to take.  Past Medical History  Diagnosis Date  . Chicken pox   . Ulcer   . Allergy   . Hyperlipidemia   . Clotting disorder     only with second pregnancy  . Blood transfusion   . Hypertension     Current Outpatient Prescriptions on File Prior to Visit  Medication Sig Dispense Refill  . amLODipine (NORVASC) 5 MG tablet Take 1 tablet (5 mg total) by mouth daily. 90 tablet 2  . Glucosamine 750 MG TABS Take 2 tablets by mouth daily.    . rosuvastatin (CRESTOR) 10 MG tablet TAKE 1 TABLET BY MOUTH EVERY DAY 90 tablet 2  . VAGIFEM 10 MCG TABS vaginal tablet      No current facility-administered medications on file prior to visit.    Allergies  Allergen Reactions  . Coumadin [Warfarin Sodium]     Itch     Family History  Problem Relation Age of Onset  . Arthritis Mother   . Hyperlipidemia Mother   . Hypertension Mother   . Cancer Father   . Hyperlipidemia Sister     History   Social History  . Marital Status: Married    Spouse Name: N/A  . Number of Children: N/A  . Years of Education: N/A   Social History Main Topics  . Smoking status: Never Smoker   . Smokeless tobacco: Not on file  . Alcohol Use: No  . Drug Use: No  . Sexual Activity: Not on file   Other Topics Concern  . None   Social History Narrative   Review of Systems - See HPI.  All other ROS are negative.  BP 147/80 mmHg  Pulse 83  Temp(Src) 98.1 F (36.7 C) (Oral)  Resp 16  Ht 5\' 6"  (1.676 m)  Wt 166 lb 8 oz (75.524 kg)  BMI  26.89 kg/m2  SpO2 100%  LMP 01/13/2012  Physical Exam  Constitutional: She is oriented to person, place, and time and well-developed, well-nourished, and in no distress.  HENT:  Head: Normocephalic.  Neck: Neck supple.  Cardiovascular: Normal rate, regular rhythm, normal heart sounds and intact distal pulses.   Pulmonary/Chest: Effort normal and breath sounds normal. No respiratory distress. She has no wheezes. She has no rales. She exhibits no tenderness.  Musculoskeletal:       Right shoulder: She exhibits tenderness and spasm. She exhibits no bony tenderness.  Neurological: She is alert and oriented to person, place, and time.  Skin: Skin is warm and dry. No rash noted.  Psychiatric: Affect normal.  Vitals reviewed.  Assessment/Plan: Bilateral low back pain without sciatica Rx Tramadol. Robaxin QHS for spasm.  Salon Pas patches encouraged. Use new ergonomic chair at work.  Ambulate for 5 minutes for every 30 minutes of sitting.  Recommend PT if symptoms not resolving.

## 2014-07-17 NOTE — Assessment & Plan Note (Signed)
Rx Tramadol. Robaxin QHS for spasm.  Salon Pas patches encouraged. Use new ergonomic chair at work.  Ambulate for 5 minutes for every 30 minutes of sitting.  Recommend PT if symptoms not resolving.

## 2014-07-17 NOTE — Patient Instructions (Signed)
Please take Tramadol each morning and Aleve in the evening with food. Stay well hydrated and rest. Avoid heavy lifting or overexertion. Use Robaxin as directed. If symptoms are not resolving, we will need to obtain x-ray and start physical therapy.

## 2014-08-02 ENCOUNTER — Emergency Department (HOSPITAL_BASED_OUTPATIENT_CLINIC_OR_DEPARTMENT_OTHER): Payer: Commercial Managed Care - PPO

## 2014-08-02 ENCOUNTER — Encounter (HOSPITAL_BASED_OUTPATIENT_CLINIC_OR_DEPARTMENT_OTHER): Payer: Self-pay | Admitting: Emergency Medicine

## 2014-08-02 ENCOUNTER — Emergency Department (HOSPITAL_BASED_OUTPATIENT_CLINIC_OR_DEPARTMENT_OTHER)
Admission: EM | Admit: 2014-08-02 | Discharge: 2014-08-02 | Disposition: A | Discharge status: Home / Self Care | Payer: Commercial Managed Care - PPO | Attending: Emergency Medicine | Admitting: Emergency Medicine

## 2014-08-02 DIAGNOSIS — Z862 Personal history of diseases of the blood and blood-forming organs and certain disorders involving the immune mechanism: Secondary | ICD-10-CM | POA: Insufficient documentation

## 2014-08-02 DIAGNOSIS — Z79899 Other long term (current) drug therapy: Secondary | ICD-10-CM | POA: Insufficient documentation

## 2014-08-02 DIAGNOSIS — N390 Urinary tract infection, site not specified: Secondary | ICD-10-CM | POA: Diagnosis not present

## 2014-08-02 DIAGNOSIS — Z872 Personal history of diseases of the skin and subcutaneous tissue: Secondary | ICD-10-CM | POA: Diagnosis not present

## 2014-08-02 DIAGNOSIS — Z9851 Tubal ligation status: Secondary | ICD-10-CM | POA: Diagnosis not present

## 2014-08-02 DIAGNOSIS — E785 Hyperlipidemia, unspecified: Secondary | ICD-10-CM | POA: Insufficient documentation

## 2014-08-02 DIAGNOSIS — R109 Unspecified abdominal pain: Secondary | ICD-10-CM | POA: Diagnosis present

## 2014-08-02 DIAGNOSIS — N23 Unspecified renal colic: Secondary | ICD-10-CM | POA: Insufficient documentation

## 2014-08-02 DIAGNOSIS — I10 Essential (primary) hypertension: Secondary | ICD-10-CM | POA: Insufficient documentation

## 2014-08-02 DIAGNOSIS — K5900 Constipation, unspecified: Secondary | ICD-10-CM | POA: Diagnosis not present

## 2014-08-02 DIAGNOSIS — Z8619 Personal history of other infectious and parasitic diseases: Secondary | ICD-10-CM | POA: Insufficient documentation

## 2014-08-02 LAB — LIPASE, BLOOD: Lipase: 21 U/L — ABNORMAL LOW (ref 22–51)

## 2014-08-02 LAB — CBC WITH DIFFERENTIAL/PLATELET
Basophils Absolute: 0 10*3/uL (ref 0.0–0.1)
Basophils Relative: 0 % (ref 0–1)
EOS PCT: 1 % (ref 0–5)
Eosinophils Absolute: 0.1 10*3/uL (ref 0.0–0.7)
HEMATOCRIT: 39.2 % (ref 36.0–46.0)
Hemoglobin: 13.2 g/dL (ref 12.0–15.0)
LYMPHS ABS: 1.5 10*3/uL (ref 0.7–4.0)
LYMPHS PCT: 24 % (ref 12–46)
MCH: 32.8 pg (ref 26.0–34.0)
MCHC: 33.7 g/dL (ref 30.0–36.0)
MCV: 97.3 fL (ref 78.0–100.0)
Monocytes Absolute: 0.4 10*3/uL (ref 0.1–1.0)
Monocytes Relative: 6 % (ref 3–12)
Neutro Abs: 4.5 10*3/uL (ref 1.7–7.7)
Neutrophils Relative %: 69 % (ref 43–77)
Platelets: 178 10*3/uL (ref 150–400)
RBC: 4.03 MIL/uL (ref 3.87–5.11)
RDW: 11.5 % (ref 11.5–15.5)
WBC: 6.5 10*3/uL (ref 4.0–10.5)

## 2014-08-02 LAB — URINE MICROSCOPIC-ADD ON

## 2014-08-02 LAB — URINALYSIS, ROUTINE W REFLEX MICROSCOPIC
Bilirubin Urine: NEGATIVE
Glucose, UA: 100 mg/dL — AB
Hgb urine dipstick: NEGATIVE
KETONES UR: NEGATIVE mg/dL
NITRITE: NEGATIVE
Protein, ur: NEGATIVE mg/dL
SPECIFIC GRAVITY, URINE: 1.015 (ref 1.005–1.030)
Urobilinogen, UA: 0.2 mg/dL (ref 0.0–1.0)
pH: 8 (ref 5.0–8.0)

## 2014-08-02 LAB — COMPREHENSIVE METABOLIC PANEL
ALT: 19 U/L (ref 14–54)
AST: 26 U/L (ref 15–41)
Albumin: 4.6 g/dL (ref 3.5–5.0)
Alkaline Phosphatase: 67 U/L (ref 38–126)
Anion gap: 9 (ref 5–15)
BUN: 23 mg/dL — ABNORMAL HIGH (ref 6–20)
CALCIUM: 9.3 mg/dL (ref 8.9–10.3)
CO2: 27 mmol/L (ref 22–32)
CREATININE: 1.01 mg/dL — AB (ref 0.44–1.00)
Chloride: 106 mmol/L (ref 101–111)
GLUCOSE: 167 mg/dL — AB (ref 65–99)
Potassium: 3.4 mmol/L — ABNORMAL LOW (ref 3.5–5.1)
Sodium: 142 mmol/L (ref 135–145)
Total Bilirubin: 0.5 mg/dL (ref 0.3–1.2)
Total Protein: 7.9 g/dL (ref 6.5–8.1)

## 2014-08-02 MED ORDER — KETOROLAC TROMETHAMINE 60 MG/2ML IM SOLN
60.0000 mg | Freq: Once | INTRAMUSCULAR | Status: AC
  Administered 2014-08-02: 60 mg via INTRAMUSCULAR
  Filled 2014-08-02: qty 2

## 2014-08-02 MED ORDER — KETOROLAC TROMETHAMINE 10 MG PO TABS: 10.0000 mg | ORAL_TABLET | Freq: Four times a day (QID) | ORAL | Status: DC | PRN

## 2014-08-02 MED ORDER — CEFTRIAXONE SODIUM 1 G IJ SOLR
1.0000 g | Freq: Once | INTRAMUSCULAR | Status: AC
  Administered 2014-08-02: 1 g via INTRAVENOUS

## 2014-08-02 MED ORDER — CEFTRIAXONE SODIUM 1 G IJ SOLR
INTRAMUSCULAR | Status: DC
Start: 2014-08-02 — End: 2014-08-02
  Filled 2014-08-02: qty 10

## 2014-08-02 MED ORDER — OXYCODONE-ACETAMINOPHEN 5-325 MG PO TABS: 1.0000 | ORAL_TABLET | ORAL | Status: DC | PRN

## 2014-08-02 MED ORDER — ONDANSETRON 4 MG PO TBDP: ORAL_TABLET | ORAL | Status: DC

## 2014-08-02 MED ORDER — SODIUM CHLORIDE 0.9 % IV BOLUS (SEPSIS)
1000.0000 mL | Freq: Once | INTRAVENOUS | Status: AC
  Administered 2014-08-02: 1000 mL via INTRAVENOUS

## 2014-08-02 MED ORDER — TAMSULOSIN HCL 0.4 MG PO CAPS: 0.4000 mg | ORAL_CAPSULE | Freq: Every day | ORAL | Status: DC

## 2014-08-02 MED ORDER — ONDANSETRON HCL 4 MG/2ML IJ SOLN
4.0000 mg | Freq: Once | INTRAMUSCULAR | Status: AC
  Administered 2014-08-02: 4 mg via INTRAVENOUS
  Filled 2014-08-02: qty 2

## 2014-08-02 MED ORDER — CEPHALEXIN 500 MG PO CAPS: 500.0000 mg | ORAL_CAPSULE | Freq: Four times a day (QID) | ORAL | Status: DC

## 2014-08-02 MED ORDER — POLYETHYLENE GLYCOL 3350 17 G PO PACK: 17.0000 g | PACK | Freq: Every day | ORAL | Status: DC | PRN

## 2014-08-02 MED ORDER — MORPHINE SULFATE 4 MG/ML IJ SOLN
4.0000 mg | Freq: Once | INTRAMUSCULAR | Status: AC
  Administered 2014-08-02: 4 mg via INTRAVENOUS
  Filled 2014-08-02: qty 1

## 2014-08-02 NOTE — ED Notes (Signed)
Per ems_ Patient was woke up by pain, the patient tried tramadol and heating pad. The patient reports that she took the pain medications at 1115 - but threw up after taking the medications.

## 2014-08-02 NOTE — ED Notes (Signed)
Woke w rt flank pain  Denies urinary sx

## 2014-08-02 NOTE — ED Provider Notes (Signed)
CSN: [id number]     Arrival date & time 08/02/14  0042 History   First MD Initiated Contact with Patient 08/02/14 0113     Chief Complaint  Patient presents with  . Flank Pain     (Consider location/radiation/quality/duration/timing/severity/associated sxs/prior Treatment) HPI Patient with lower back pain for the past 4 weeks. She's seen several providers and been prescribed muscle relaxants and pain medication. States she woke up around 11:15 with worsening back pain. The pain does not radiate. She states the pain is more on the right side than her previous low back pain. She had an episode of vomiting. She reports feeling the urge to have bowel movement. She had one small bowel movement. She denies any urinary symptoms including dysuria, frequency, hematuria. Denies abdominal pain. Past Medical History  Diagnosis Date  . Chicken pox   . Ulcer   . Allergy   . Hyperlipidemia   . Clotting disorder     only with second pregnancy  . Blood transfusion   . Hypertension    Past Surgical History  Procedure Laterality Date  . Tubal ligation  1994  . Knee arthroplasty  2012  . Cesarean section  1989 & 1991   Family History  Problem Relation Age of Onset  . Arthritis Mother   . Hyperlipidemia Mother   . Hypertension Mother   . Cancer Father   . Hyperlipidemia Sister    History  Substance Use Topics  . Smoking status: Never Smoker   . Smokeless tobacco: Not on file  . Alcohol Use: No   OB History    No data available     Review of Systems  Constitutional: Negative for fever and chills.  Respiratory: Negative for shortness of breath.   Cardiovascular: Negative for chest pain.  Gastrointestinal: Positive for nausea and vomiting. Negative for abdominal pain and diarrhea.  Genitourinary: Negative for dysuria, frequency, hematuria, flank pain and pelvic pain.  Musculoskeletal: Positive for myalgias and back pain. Negative for neck pain and neck stiffness.  Skin: Negative for rash  and wound.  Neurological: Negative for dizziness, weakness, light-headedness, numbness and headaches.  All other systems reviewed and are negative.     Allergies  Coumadin  Home Medications   Prior to Admission medications   Medication Sig Start Date End Date Taking? Authorizing Provider  amLODipine (NORVASC) 5 MG tablet Take 1 tablet (5 mg total) by mouth daily. 04/12/14   Midge Minium, MD  cephALEXin (KEFLEX) 500 MG capsule Take 1 capsule (500 mg total) by mouth 4 (four) times daily. 08/02/14   Julianne Rice, MD  Glucosamine 750 MG TABS Take 2 tablets by mouth daily.    Historical Provider, MD  ketorolac (TORADOL) 10 MG tablet Take 1 tablet (10 mg total) by mouth every 6 (six) hours as needed for moderate pain or severe pain. 08/02/14   Julianne Rice, MD  methocarbamol (ROBAXIN) 500 MG tablet Take 1 tablet (500 mg total) by mouth every 8 (eight) hours as needed for muscle spasms. 07/17/14   Brunetta Jeans, PA-C  Multiple Vitamin (MULTIVITAMIN) tablet Take 1 tablet by mouth daily.    Historical Provider, MD  ondansetron (ZOFRAN ODT) 4 MG disintegrating tablet 4mg  ODT q4 hours prn nausea/vomit 08/02/14   Julianne Rice, MD  oxyCODONE-acetaminophen (PERCOCET) 5-325 MG per tablet Take 1-2 tablets by mouth every 4 (four) hours as needed for moderate pain or severe pain. 08/02/14   Julianne Rice, MD  polyethylene glycol [facility] Floria Raveling) packet Take 17 g  by mouth daily as needed for mild constipation or moderate constipation. 08/02/14   Julianne Rice, MD  rosuvastatin (CRESTOR) 10 MG tablet TAKE 1 TABLET BY MOUTH EVERY DAY 04/12/14   Midge Minium, MD  tamsulosin (FLOMAX) 0.4 MG CAPS capsule Take 1 capsule (0.4 mg total) by mouth daily. 08/02/14   Julianne Rice, MD  traMADol (ULTRAM) 50 MG tablet Take 1 tablet (50 mg total) by mouth every 8 (eight) hours as needed. 07/17/14   Brunetta Jeans, PA-C  VAGIFEM 10 MCG TABS vaginal tablet  09/29/13   Historical Provider, MD   BP 127/71  mmHg  Pulse 88  Temp(Src) 97.3 F (36.3 C)  Resp 16  SpO2 99%  LMP 01/13/2012 Physical Exam  Constitutional: She is oriented to person, place, and time. She appears well-developed and well-nourished. No distress.  Patient is lying with eyes closed. Speaks in a low voice  HENT:  Head: Normocephalic and atraumatic.  Mouth/Throat: Oropharynx is clear and moist.  Eyes: EOM are normal. Pupils are equal, round, and reactive to light.  Neck: Normal range of motion. Neck supple.  Cardiovascular: Normal rate and regular rhythm.   Pulmonary/Chest: Effort normal and breath sounds normal. No respiratory distress. She has no wheezes. She has no rales.  Abdominal: Soft. Bowel sounds are normal. She exhibits no distension and no mass. There is tenderness (diffuse abdominal tenderness with palpation.). There is no rebound and no guarding.  Musculoskeletal: Normal range of motion. She exhibits tenderness. She exhibits no edema.  Patient with lower lumbar tenderness in the midline and paraspinal areas. There is no step-offs. No definite CVA tenderness. Negative straight leg raise  Neurological: She is alert and oriented to person, place, and time.  5/5 motor in all extremities. Sensation is fully intact. No saddle anesthesia.  Skin: Skin is warm and dry. No rash noted. No erythema.  Psychiatric: She has a normal mood and affect. Her behavior is normal.  Nursing note and vitals reviewed.   ED Course  Procedures (including critical care time) Labs Review Labs Reviewed  URINALYSIS, ROUTINE W REFLEX MICROSCOPIC (NOT AT [facility]) - Abnormal; Notable for the following:    APPearance CLOUDY (*)    Glucose, UA 100 (*)    Leukocytes, UA MODERATE (*)    All other components within normal limits  URINE MICROSCOPIC-ADD ON - Abnormal; Notable for the following:    Bacteria, UA MANY (*)    All other components within normal limits  COMPREHENSIVE METABOLIC PANEL - Abnormal; Notable for the following:    Potassium  3.4 (*)    Glucose, Bld 167 (*)    BUN 23 (*)    Creatinine, Ser 1.01 (*)    All other components within normal limits  LIPASE, BLOOD - Abnormal; Notable for the following:    Lipase 21 (*)    All other components within normal limits  CBC WITH DIFFERENTIAL/PLATELET    Imaging Review Dg Abd Acute W/chest  08/02/2014   CLINICAL DATA:  Acute onset of right flank pain.  Initial encounter.  EXAM: DG ABDOMEN ACUTE W/ 1V CHEST  COMPARISON:  None.  FINDINGS: The lungs are well-aerated. Mild vascular congestion is noted. There is no evidence of focal opacification, pleural effusion or pneumothorax. The cardiomediastinal silhouette is borderline normal in size.  The visualized bowel gas pattern is unremarkable. Scattered stool and air are seen within the colon; there is no evidence of small bowel dilatation to suggest obstruction. No free intra-abdominal air is identified on the  provided upright view. Scattered calcifications are noted about the right side of the abdomen, of uncertain significance. A few of these may reflect right renal stones.  No acute osseous abnormalities are seen; the sacroiliac joints are unremarkable in appearance.  IMPRESSION: 1. Scattered calcifications about the right side of the abdomen. A few of these may reflect right renal stones. Given the patient's symptoms, a right ureteral stone cannot be excluded. 2. Mild vascular congestion noted.  Lungs remain grossly clear.   Electronically Signed   By: Garald Balding M.D.   On: 08/02/2014 02:19   Ct Renal Stone Study  08/02/2014   CLINICAL DATA:  Right flank pain and nausea.  Evaluate for stone.  EXAM: CT ABDOMEN AND PELVIS WITHOUT CONTRAST  TECHNIQUE: Multidetector CT imaging of the abdomen and pelvis was performed following the standard protocol without IV contrast.  COMPARISON:  None.  FINDINGS: BODY WALL: No contributory findings.  LOWER CHEST: No contributory findings.  ABDOMEN/PELVIS:  Liver: No focal abnormality.  Biliary: No  evidence of biliary obstruction or stone.  Pancreas: Unremarkable.  Spleen: Unremarkable.  Adrenals: Unremarkable.  Kidneys and ureters: Bilateral nephrolithiasis, with stone burden greater on the right where there are 3 calculi measuring up to 6 mm. Two smaller left renal calculi are noted. There is right hydroureteronephrosis and perinephric edema secondary to a 4 mm stone at the right ureteral vesicular junction. No left ureteral stone or hydronephrosis.  Bladder: Unremarkable.  Reproductive: No pathologic findings.  Bowel: No obstruction. No appendicitis.  Retroperitoneum: No mass or adenopathy.  Peritoneum: No ascites or pneumoperitoneum.  Vascular: No acute abnormality.  OSSEOUS: No acute abnormalities.  IMPRESSION: 1. Obstructing 4 mm stone at the right ureteral vesicular junction. 2. Bilateral nephrolithiasis, greater on the right.   Electronically Signed   By: Monte Fantasia M.D.   On: 08/02/2014 03:12     EKG Interpretation None      MDM   Final diagnoses:  Abdominal pain  Right flank pain  Renal colic on right side  Constipation, unspecified constipation type  UTI (lower urinary tract infection)   Pain is improved. Patient is resting comfortably. Abdominal exam is benign. Vital signs are normal. Patient has a normal white blood cell count. Evidence of urinary tract infection. Patient also has a 4 mm stone at the right UVJ. Do not believe the patient has infected stone or any evidence of sepsis. She's been given a dose of IV Rocephin in the emergency department. We'll discharge her home to follow-up with urology. She is aware of the need for close follow-up. She's been given strict return precautions including return for fever, persistent vomiting, worsening pain or for any concerns. Patient also likely has some constipation due to previous narcotic abuse. Given that she will need stronger pain medications for her kidney stones will start on MiraLAX as well.     Julianne Rice,  MD 08/02/14 [phone]

## 2014-08-02 NOTE — Discharge Instructions (Signed)
Kidney Stones °Kidney stones (urolithiasis) are deposits that form inside your kidneys. The intense pain is caused by the stone moving through the urinary tract. When the stone moves, the ureter goes into spasm around the stone. The stone is usually passed in the urine.  °CAUSES  °· A disorder that makes certain neck glands produce too much parathyroid hormone (primary hyperparathyroidism). °· A buildup of uric acid crystals, similar to gout in your joints. °· Narrowing (stricture) of the ureter. °· A kidney obstruction present at birth (congenital obstruction). °· Previous surgery on the kidney or ureters. °· Numerous kidney infections. °SYMPTOMS  °· Feeling sick to your stomach (nauseous). °· Throwing up (vomiting). °· Blood in the urine (hematuria). °· Pain that usually spreads (radiates) to the groin. °· Frequency or urgency of urination. °DIAGNOSIS  °· Taking a history and physical exam. °· Blood or urine tests. °· CT scan. °· Occasionally, an examination of the inside of the urinary bladder (cystoscopy) is performed. °TREATMENT  °· Observation. °· Increasing your fluid intake. °· Extracorporeal shock wave lithotripsy--This is a noninvasive procedure that uses shock waves to break up kidney stones. °· Surgery may be needed if you have severe pain or persistent obstruction. There are various surgical procedures. Most of the procedures are performed with the use of small instruments. Only small incisions are needed to accommodate these instruments, so recovery time is minimized. °The size, location, and chemical composition are all important variables that will determine the proper choice of action for you. Talk to your health care provider to better understand your situation so that you will minimize the risk of injury to yourself and your kidney.  °HOME CARE INSTRUCTIONS  °· Drink enough water and fluids to keep your urine clear or pale yellow. This will help you to pass the stone or stone fragments. °· Strain  all urine through the provided strainer. Keep all particulate matter and stones for your health care provider to see. The stone causing the pain may be as small as a grain of salt. It is very important to use the strainer each and every time you pass your urine. The collection of your stone will allow your health care provider to analyze it and verify that a stone has actually passed. The stone analysis will often identify what you can do to reduce the incidence of recurrences. °· Only take over-the-counter or prescription medicines for pain, discomfort, or fever as directed by your health care provider. °· Make a follow-up appointment with your health care provider as directed. °· Get follow-up X-rays if required. The absence of pain does not always mean that the stone has passed. It may have only stopped moving. If the urine remains completely obstructed, it can cause loss of kidney function or even complete destruction of the kidney. It is your responsibility to make sure X-rays and follow-ups are completed. Ultrasounds of the kidney can show blockages and the status of the kidney. Ultrasounds are not associated with any radiation and can be performed easily in a matter of minutes. °SEEK MEDICAL CARE IF: °· You experience pain that is progressive and unresponsive to any pain medicine you have been prescribed. °SEEK IMMEDIATE MEDICAL CARE IF:  °· Pain cannot be controlled with the prescribed medicine. °· You have a fever or shaking chills. °· The severity or intensity of pain increases over 18 hours and is not relieved by pain medicine. °· You develop a new onset of abdominal pain. °· You feel faint or pass out. °·   You are unable to urinate. MAKE SURE YOU:   Understand these instructions.  Will watch your condition.  Will get help right away if you are not doing well or get worse. Document Released: 01/13/2005 Document Revised: 09/15/2012 Document Reviewed: 06/16/2012 [facility] Patient Information 2015  [place], [place]. This information is not intended to replace advice given to you by your health care provider. Make sure you discuss any questions you have with your health care provider.  Urinary Tract Infection Urinary tract infections (UTIs) can develop anywhere along your urinary tract. Your urinary tract is your body's drainage system for removing wastes and extra water. Your urinary tract includes two kidneys, two ureters, a bladder, and a urethra. Your kidneys are a pair of bean-shaped organs. Each kidney is about the size of your fist. They are located below your ribs, one on each side of your spine. CAUSES Infections are caused by microbes, which are microscopic organisms, including fungi, viruses, and bacteria. These organisms are so small that they can only be seen through a microscope. Bacteria are the microbes that most commonly cause UTIs. SYMPTOMS  Symptoms of UTIs may vary by age and gender of the patient and by the location of the infection. Symptoms in young women typically include a frequent and intense urge to urinate and a painful, burning feeling in the bladder or urethra during urination. Older women and men are more likely to be tired, shaky, and weak and have muscle aches and abdominal pain. A fever may mean the infection is in your kidneys. Other symptoms of a kidney infection include pain in your back or sides below the ribs, nausea, and vomiting. DIAGNOSIS To diagnose a UTI, your caregiver will ask you about your symptoms. Your caregiver also will ask to provide a urine sample. The urine sample will be tested for bacteria and white blood cells. White blood cells are made by your body to help fight infection. TREATMENT  Typically, UTIs can be treated with medication. Because most UTIs are caused by a bacterial infection, they usually can be treated with the use of antibiotics. The choice of antibiotic and length of treatment depend on your symptoms and the type of bacteria causing  your infection. HOME CARE INSTRUCTIONS  If you were prescribed antibiotics, take them exactly as your caregiver instructs you. Finish the medication even if you feel better after you have only taken some of the medication.  Drink enough water and fluids to keep your urine clear or pale yellow.  Avoid caffeine, tea, and carbonated beverages. They tend to irritate your bladder.  Empty your bladder often. Avoid holding urine for long periods of time.  Empty your bladder before and after sexual intercourse.  After a bowel movement, women should cleanse from front to back. Use each tissue only once. SEEK MEDICAL CARE IF:   You have back pain.  You develop a fever.  Your symptoms do not begin to resolve within 3 days. SEEK IMMEDIATE MEDICAL CARE IF:   You have severe back pain or lower abdominal pain.  You develop chills.  You have nausea or vomiting.  You have continued burning or discomfort with urination. MAKE SURE YOU:   Understand these instructions.  Will watch your condition.  Will get help right away if you are not doing well or get worse. Document Released: 10/23/2004 Document Revised: 07/15/2011 Document Reviewed: 02/21/2011 [facility] Patient Information 2015 [place], [place]. This information is not intended to replace advice given to you by your health care provider. Make  sure you discuss any questions you have with your health care provider.  Constipation Constipation is when a person has fewer than three bowel movements a week, has difficulty having a bowel movement, or has stools that are dry, hard, or larger than normal. As people grow older, constipation is more common. If you try to fix constipation with medicines that make you have a bowel movement (laxatives), the problem may get worse. Long-term laxative use may cause the muscles of the colon to become weak. A low-fiber diet, not taking in enough fluids, and taking certain medicines may make constipation worse.    CAUSES   Certain medicines, such as antidepressants, pain medicine, iron supplements, antacids, and water pills.   Certain diseases, such as diabetes, irritable bowel syndrome (IBS), thyroid disease, or depression.   Not drinking enough water.   Not eating enough fiber-rich foods.   Stress or travel.   Lack of physical activity or exercise.   Ignoring the urge to have a bowel movement.   Using laxatives too much.  SIGNS AND SYMPTOMS   Having fewer than three bowel movements a week.   Straining to have a bowel movement.   Having stools that are hard, dry, or larger than normal.   Feeling full or bloated.   Pain in the lower abdomen.   Not feeling relief after having a bowel movement.  DIAGNOSIS  Your health care provider will take a medical history and perform a physical exam. Further testing may be done for severe constipation. Some tests may include:  A barium enema X-ray to examine your rectum, colon, and, sometimes, your small intestine.   A sigmoidoscopy to examine your lower colon.   A colonoscopy to examine your entire colon. TREATMENT  Treatment will depend on the severity of your constipation and what is causing it. Some dietary treatments include drinking more fluids and eating more fiber-rich foods. Lifestyle treatments may include regular exercise. If these diet and lifestyle recommendations do not help, your health care provider may recommend taking over-the-counter laxative medicines to help you have bowel movements. Prescription medicines may be prescribed if over-the-counter medicines do not work.  HOME CARE INSTRUCTIONS   Eat foods that have a lot of fiber, such as fruits, vegetables, whole grains, and beans.  Limit foods high in fat and processed sugars, such as french fries, hamburgers, cookies, candies, and soda.   A fiber supplement may be added to your diet if you cannot get enough fiber from foods.   Drink enough fluids to keep  your urine clear or pale yellow.   Exercise regularly or as directed by your health care provider.   Go to the restroom when you have the urge to go. Do not hold it.   Only take over-the-counter or prescription medicines as directed by your health care provider. Do not take other medicines for constipation without talking to your health care provider first.  [place] IF:   You have bright red blood in your stool.   Your constipation lasts for more than 4 days or gets worse.   You have abdominal or rectal pain.   You have thin, pencil-like stools.   You have unexplained weight loss. MAKE SURE YOU:   Understand these instructions.  Will watch your condition.  Will get help right away if you are not doing well or get worse. Document Released: 10/12/2003 Document Revised: 01/18/2013 Document Reviewed: 10/25/2012 [facility] Patient Information 2015 [place], [place]. This information is not intended to replace  advice given to you by your health care provider. Make sure you discuss any questions you have with your health care provider.

## 2014-08-08 ENCOUNTER — Other Ambulatory Visit: Payer: Self-pay | Admitting: Urology

## 2014-08-14 ENCOUNTER — Encounter (HOSPITAL_BASED_OUTPATIENT_CLINIC_OR_DEPARTMENT_OTHER): Payer: Self-pay | Admitting: *Deleted

## 2014-08-14 NOTE — Progress Notes (Signed)
NPO AFTER MN.  ARRIVE AT 0900.  NEED EKG.  CURRENT LAB RESULTS IN CHART AND EPIC. WILL TAKE NORVASC AND CRESTOR AM DOS W/ SIPS OF WATER.

## 2014-08-16 ENCOUNTER — Ambulatory Visit (HOSPITAL_BASED_OUTPATIENT_CLINIC_OR_DEPARTMENT_OTHER): Payer: Commercial Managed Care - PPO | Admitting: Anesthesiology

## 2014-08-16 ENCOUNTER — Ambulatory Visit (HOSPITAL_BASED_OUTPATIENT_CLINIC_OR_DEPARTMENT_OTHER)
Admission: RE | Admit: 2014-08-16 | Discharge: 2014-08-16 | Disposition: A | Discharge status: Home / Self Care | Payer: Commercial Managed Care - PPO | Source: Ambulatory Visit | Attending: Urology | Admitting: Urology

## 2014-08-16 ENCOUNTER — Other Ambulatory Visit: Payer: Self-pay

## 2014-08-16 ENCOUNTER — Encounter (HOSPITAL_BASED_OUTPATIENT_CLINIC_OR_DEPARTMENT_OTHER): Payer: Self-pay | Admitting: *Deleted

## 2014-08-16 ENCOUNTER — Encounter (HOSPITAL_BASED_OUTPATIENT_CLINIC_OR_DEPARTMENT_OTHER)
Admission: RE | Disposition: A | Discharge status: Home / Self Care | Payer: Self-pay | Source: Ambulatory Visit | Attending: Urology

## 2014-08-16 DIAGNOSIS — E785 Hyperlipidemia, unspecified: Secondary | ICD-10-CM | POA: Diagnosis not present

## 2014-08-16 DIAGNOSIS — R109 Unspecified abdominal pain: Secondary | ICD-10-CM | POA: Diagnosis present

## 2014-08-16 DIAGNOSIS — N289 Disorder of kidney and ureter, unspecified: Secondary | ICD-10-CM | POA: Insufficient documentation

## 2014-08-16 DIAGNOSIS — N200 Calculus of kidney: Secondary | ICD-10-CM

## 2014-08-16 DIAGNOSIS — I509 Heart failure, unspecified: Secondary | ICD-10-CM | POA: Insufficient documentation

## 2014-08-16 DIAGNOSIS — I10 Essential (primary) hypertension: Secondary | ICD-10-CM | POA: Diagnosis not present

## 2014-08-16 DIAGNOSIS — Z79899 Other long term (current) drug therapy: Secondary | ICD-10-CM | POA: Diagnosis not present

## 2014-08-16 DIAGNOSIS — I252 Old myocardial infarction: Secondary | ICD-10-CM | POA: Diagnosis not present

## 2014-08-16 DIAGNOSIS — Z86718 Personal history of other venous thrombosis and embolism: Secondary | ICD-10-CM | POA: Insufficient documentation

## 2014-08-16 HISTORY — DX: Presence of spectacles and contact lenses: Z97.3

## 2014-08-16 HISTORY — DX: Personal history of other venous thrombosis and embolism: Z86.718

## 2014-08-16 HISTORY — PX: CYSTOSCOPY WITH URETEROSCOPY AND STENT PLACEMENT: SHX6377

## 2014-08-16 HISTORY — DX: Personal history of peptic ulcer disease: Z87.11

## 2014-08-16 SURGERY — CYSTOURETEROSCOPY, WITH STENT INSERTION
Anesthesia: General | Laterality: Right

## 2014-08-16 MED ORDER — PROPOFOL 10 MG/ML IV BOLUS
INTRAVENOUS | Status: DC | PRN
  Administered 2014-08-16: 160 mg via INTRAVENOUS

## 2014-08-16 MED ORDER — DEXAMETHASONE SODIUM PHOSPHATE 4 MG/ML IJ SOLN
INTRAMUSCULAR | Status: DC | PRN
  Administered 2014-08-16: 10 mg via INTRAVENOUS

## 2014-08-16 MED ORDER — LACTATED RINGERS IV SOLN
INTRAVENOUS | Status: DC
  Administered 2014-08-16: 10:00:00 via INTRAVENOUS
  Filled 2014-08-16: qty 1000

## 2014-08-16 MED ORDER — LIDOCAINE HCL (CARDIAC) 20 MG/ML IV SOLN
INTRAVENOUS | Status: DC | PRN
  Administered 2014-08-16: 60 mg via INTRAVENOUS

## 2014-08-16 MED ORDER — OXYCODONE HCL 5 MG/5ML PO SOLN
5.0000 mg | Freq: Once | ORAL | Status: DC | PRN
  Filled 2014-08-16: qty 5

## 2014-08-16 MED ORDER — CEFAZOLIN SODIUM-DEXTROSE 2-3 GM-% IV SOLR
INTRAVENOUS | Status: AC
  Filled 2014-08-16: qty 50

## 2014-08-16 MED ORDER — CHLORHEXIDINE GLUCONATE 4 % EX LIQD
Freq: Once | CUTANEOUS | Status: DC
  Filled 2014-08-16: qty 15

## 2014-08-16 MED ORDER — FENTANYL CITRATE (PF) 100 MCG/2ML IJ SOLN
25.0000 ug | INTRAMUSCULAR | Status: DC | PRN
  Filled 2014-08-16: qty 1

## 2014-08-16 MED ORDER — MIDAZOLAM HCL 2 MG/2ML IJ SOLN
INTRAMUSCULAR | Status: AC
  Filled 2014-08-16: qty 2

## 2014-08-16 MED ORDER — ACETAMINOPHEN 325 MG PO TABS
325.0000 mg | ORAL_TABLET | ORAL | Status: DC | PRN
  Filled 2014-08-16: qty 2

## 2014-08-16 MED ORDER — ACETAMINOPHEN 160 MG/5ML PO SOLN
325.0000 mg | ORAL | Status: DC | PRN
  Filled 2014-08-16: qty 20.3

## 2014-08-16 MED ORDER — KETOROLAC TROMETHAMINE 30 MG/ML IJ SOLN
INTRAMUSCULAR | Status: DC | PRN
  Administered 2014-08-16: 30 mg via INTRAVENOUS

## 2014-08-16 MED ORDER — IOHEXOL 300 MG/ML  SOLN
INTRAMUSCULAR | Status: DC | PRN
  Administered 2014-08-16: 3 mL via URETHRAL

## 2014-08-16 MED ORDER — OXYCODONE HCL 5 MG PO TABS
5.0000 mg | ORAL_TABLET | Freq: Once | ORAL | Status: DC | PRN
  Filled 2014-08-16: qty 1

## 2014-08-16 MED ORDER — ONDANSETRON HCL 4 MG/2ML IJ SOLN
INTRAMUSCULAR | Status: DC | PRN
  Administered 2014-08-16: 4 mg via INTRAVENOUS

## 2014-08-16 MED ORDER — MIDAZOLAM HCL 5 MG/5ML IJ SOLN
INTRAMUSCULAR | Status: DC | PRN
  Administered 2014-08-16: 2 mg via INTRAVENOUS

## 2014-08-16 MED ORDER — FENTANYL CITRATE (PF) 100 MCG/2ML IJ SOLN
INTRAMUSCULAR | Status: DC | PRN
  Administered 2014-08-16: 50 ug via INTRAVENOUS

## 2014-08-16 MED ORDER — CEFAZOLIN SODIUM 1-5 GM-% IV SOLN
1.0000 g | INTRAVENOUS | Status: DC
  Filled 2014-08-16: qty 50

## 2014-08-16 MED ORDER — OXYCODONE-ACETAMINOPHEN 5-325 MG PO TABS: 1.0000 | ORAL_TABLET | ORAL | Status: DC | PRN

## 2014-08-16 MED ORDER — CEFAZOLIN SODIUM-DEXTROSE 2-3 GM-% IV SOLR
2.0000 g | INTRAVENOUS | Status: AC
  Administered 2014-08-16: 2 g via INTRAVENOUS
  Filled 2014-08-16: qty 50

## 2014-08-16 MED ORDER — TAMSULOSIN HCL 0.4 MG PO CAPS
0.4000 mg | ORAL_CAPSULE | Freq: Every day | ORAL | Status: DC
Start: 2014-08-16 — End: 2014-10-11

## 2014-08-16 MED ORDER — FENTANYL CITRATE (PF) 100 MCG/2ML IJ SOLN
INTRAMUSCULAR | Status: AC
  Filled 2014-08-16: qty 6

## 2014-08-16 SURGICAL SUPPLY — 25 items
BAG URO CATCHER STRL LF (DRAPE) ×2 IMPLANT
BASKET LASER NITINOL 1.9FR (BASKET) IMPLANT
BASKET STONE 1.7 NGAGE (UROLOGICAL SUPPLIES) IMPLANT
BASKET ZERO TIP NITINOL 2.4FR (BASKET) IMPLANT
BSKT STON RTRVL 120 1.9FR (BASKET)
BSKT STON RTRVL ZERO TP 2.4FR (BASKET)
CANISTER SUCT LVC 12 LTR MEDI- (MISCELLANEOUS) IMPLANT
CATH INTERMIT  6FR 70CM (CATHETERS) IMPLANT
CLOTH BEACON ORANGE TIMEOUT ST (SAFETY) ×2 IMPLANT
FIBER LASER FLEXIVA 365 (UROLOGICAL SUPPLIES) IMPLANT
FIBER LASER TRAC TIP (UROLOGICAL SUPPLIES) IMPLANT
GLOVE BIO SURGEON STRL SZ8 (GLOVE) ×2 IMPLANT
GOWN STRL REUS W/ TWL LRG LVL3 (GOWN DISPOSABLE) ×1 IMPLANT
GOWN STRL REUS W/ TWL XL LVL3 (GOWN DISPOSABLE) ×1 IMPLANT
GOWN STRL REUS W/TWL LRG LVL3 (GOWN DISPOSABLE) ×2
GOWN STRL REUS W/TWL XL LVL3 (GOWN DISPOSABLE) ×2
GUIDEWIRE ANG ZIPWIRE 038X150 (WIRE) ×2 IMPLANT
GUIDEWIRE STR DUAL SENSOR (WIRE) IMPLANT
IV NS IRRIG 3000ML ARTHROMATIC (IV SOLUTION) ×2 IMPLANT
MANIFOLD NEPTUNE II (INSTRUMENTS) IMPLANT
PACK CYSTO (CUSTOM PROCEDURE TRAY) ×2 IMPLANT
SHEATH ACCESS URETERAL 38CM (SHEATH) ×1 IMPLANT
STENT URET 6FRX26 CONTOUR (STENTS) ×1 IMPLANT
SYRINGE 10CC LL (SYRINGE) ×2 IMPLANT
TUBE FEEDING 8FR 16IN STR KANG (MISCELLANEOUS) IMPLANT

## 2014-08-16 NOTE — Brief Op Note (Signed)
08/16/2014  11:15 AM  PATIENT:  Brandy May  59 y.o. female  PRE-OPERATIVE DIAGNOSIS:  RIGHT RENAL STONES  POST-OPERATIVE DIAGNOSIS:  * No post-op diagnosis entered *  PROCEDURE:  Cystoscopy, right ureteroscopy, right retrograde pyelogram, right ureteral stent placement SURGEON:  Surgeon(s) and Role:    * Cleon Gustin, MD - Primary  PHYSICIAN ASSISTANT:   ASSISTANTS: none   ANESTHESIA:   general  EBL:  Total I/O In: 200 [I.V.:200] Out: -   BLOOD ADMINISTERED:none  DRAINS: R 6x26 JJ ureteral stent  LOCAL MEDICATIONS USED:  NONE  SPECIMEN:  No Specimen  DISPOSITION OF SPECIMEN:  N/A  COUNTS:  YES  TOURNIQUET:  * No tourniquets in log *  DICTATION: .Note written in EPIC  PLAN OF CARE: Discharge to home after PACU  PATIENT DISPOSITION:  PACU - hemodynamically stable.   Delay start of Pharmacological VTE agent (>24hrs) due to surgical blood loss or risk of bleeding: not applicable

## 2014-08-16 NOTE — Discharge Instructions (Signed)

## 2014-08-16 NOTE — Anesthesia Preprocedure Evaluation (Signed)
Anesthesia Evaluation  Patient identified by MRN, date of birth, ID band Patient awake    Reviewed: Allergy & Precautions, NPO status , Patient's Chart, lab work & pertinent test results  History of Anesthesia Complications Negative for: history of anesthetic complications  Airway Mallampati: II  TM Distance: >3 FB Neck ROM: Full    Dental  (+) Teeth Intact   Pulmonary neg pulmonary ROS,  breath sounds clear to auscultation        Cardiovascular hypertension, Pt. on medications - angina- Past MI and - CHF Rhythm:Regular     Neuro/Psych negative neurological ROS  negative psych ROS   GI/Hepatic negative GI ROS, Neg liver ROS,   Endo/Other  negative endocrine ROS  Renal/GU Renal disease     Musculoskeletal   Abdominal   Peds  Hematology negative hematology ROS (+)   Anesthesia Other Findings   Reproductive/Obstetrics                             Anesthesia Physical Anesthesia Plan  ASA: II  Anesthesia Plan: General   Post-op Pain Management:    Induction: Intravenous  Airway Management Planned: LMA  Additional Equipment: None  Intra-op Plan:   Post-operative Plan: Extubation in OR  Informed Consent: I have reviewed the patients History and Physical, chart, labs and discussed the procedure including the risks, benefits and alternatives for the proposed anesthesia with the patient or authorized representative who has indicated his/her understanding and acceptance.   Dental advisory given  Plan Discussed with: CRNA and Surgeon  Anesthesia Plan Comments:         Anesthesia Quick Evaluation

## 2014-08-16 NOTE — H&P (Signed)
Urology Admission H&P  Chief Complaint: right flank pain  History of Present Illness: [name] is a 59yo with new onset right flank pain and was diagnosed with a right ureteral stone and bilateral renal stones. She does not know if she passed her ureteral stone. She presents today for stone extraction  Past Medical History  Diagnosis Date  . Hyperlipidemia   . Hypertension   . History of DVT of lower extremity     w/ second pregancy  . History of peptic ulcer   . Renal calculi     bilateral --  R > L  . Wears glasses    Past Surgical History  Procedure Laterality Date  . Tubal ligation  1994  . Cesarean section  [place]  . Knee arthroscopy Right 07-19-2010  . Right thumb trigger release  2014    Home Medications:  Prescriptions prior to admission  Medication Sig Dispense Refill Last Dose  . amLODipine (NORVASC) 5 MG tablet Take 1 tablet (5 mg total) by mouth daily. (Patient taking differently: Take 5 mg by mouth every morning. ) 90 tablet 2 08/16/2014 at 0700  . Multiple Vitamin (MULTIVITAMIN) tablet Take 1 tablet by mouth daily.   Past Week at Unknown time  . ondansetron (ZOFRAN ODT) 4 MG disintegrating tablet 4mg  ODT q4 hours prn nausea/vomit 8 tablet 0 Past Month at Unknown time  . oxyCODONE-acetaminophen (PERCOCET) 5-325 MG per tablet Take 1-2 tablets by mouth every 4 (four) hours as needed for moderate pain or severe pain. 15 tablet 0 Past Month at Unknown time  . polyethylene glycol (MIRALAX / GLYCOLAX) packet Take 17 g by mouth daily as needed for mild constipation or moderate constipation. 14 each 0 Past Week at Unknown time  . rosuvastatin (CRESTOR) 10 MG tablet TAKE 1 TABLET BY MOUTH EVERY DAY (Patient taking differently: Take 10 mg by mouth every morning. TAKE 1 TABLET BY MOUTH EVERY DAY) 90 tablet 2 08/16/2014 at 0700  . VAGIFEM 10 MCG TABS vaginal tablet Place vaginally 2 (two) times a week.    Past Week at Unknown time  . ketorolac (TORADOL) 10 MG tablet Take 1  tablet (10 mg total) by mouth every 6 (six) hours as needed for moderate pain or severe pain. 20 tablet 0 Unknown at Unknown time  . methocarbamol (ROBAXIN) 500 MG tablet Take 1 tablet (500 mg total) by mouth every 8 (eight) hours as needed for muscle spasms. 30 tablet 0 More than a month at Unknown time   Allergies:  Allergies  Allergen Reactions  . Coumadin [Warfarin Sodium] Itching    Severe Itching     Family History  Problem Relation Age of Onset  . Arthritis Mother   . Hyperlipidemia Mother   . Hypertension Mother   . Cancer Father   . Hyperlipidemia Sister    Social History:  reports that she has never smoked. She has never used smokeless tobacco. She reports that she does not drink alcohol or use illicit drugs.  Review of Systems  Genitourinary: Positive for flank pain.  All other systems reviewed and are negative.   Physical Exam:  Vital signs in last 24 hours: Temp:  [98.4 F (36.9 C)] 98.4 F (36.9 C) (07/20 0904) Pulse Rate:  [80] 80 (07/20 0904) Resp:  [16] 16 (07/20 0904) BP: (131)/(81) 131/81 mmHg (07/20 0904) SpO2:  [99 %] 99 % (07/20 0904) Weight:  [73.483 kg (162 lb)] 73.483 kg (162 lb) (07/20 0904) Physical Exam  Constitutional: She  is oriented to person, place, and time. She appears well-developed and well-nourished.  HENT:  Head: Normocephalic and atraumatic.  Eyes: EOM are normal. Pupils are equal, round, and reactive to light.  Neck: Normal range of motion. Neck supple.  Cardiovascular: Normal rate and regular rhythm.   Respiratory: Effort normal and breath sounds normal.  GI: Soft. She exhibits no distension. There is no tenderness.  Musculoskeletal: Normal range of motion.  Neurological: She is alert and oriented to person, place, and time.  Skin: Skin is warm and dry.  Psychiatric: She has a normal mood and affect. Her behavior is normal. Judgment and thought content normal.    Laboratory Data:  No results found for this or any previous  visit (from the past 24 hour(s)). No results found for this or any previous visit (from the past 240 hour(s)). Creatinine: No results for input(s): CREATININE in the last 168 hours. Baseline Creatinine:   Impression/Assessment:  Right ureteral and renal stones  Plan:  Risks/benefits/alternatives to right ureteroscopic stone extraction was explained to the patient and she understands and wishes to proceed with surgery  MCKENZIE, PATRICK L 08/16/2014, 10:10 AM

## 2014-08-16 NOTE — Anesthesia Procedure Notes (Signed)
Procedure Name: LMA Insertion Date/Time: 08/16/2014 10:50 AM Performed by: Denna Haggard D Pre-anesthesia Checklist: Patient identified, Emergency Drugs available, Suction available and Patient being monitored Patient Re-evaluated:Patient Re-evaluated prior to inductionOxygen Delivery Method: Circle System Utilized Preoxygenation: Pre-oxygenation with 100% oxygen Intubation Type: IV induction Ventilation: Mask ventilation without difficulty LMA: LMA inserted LMA Size: 4.0 Number of attempts: 1 Airway Equipment and Method: Bite block Placement Confirmation: positive ETCO2 Tube secured with: Tape Dental Injury: Teeth and Oropharynx as per pre-operative assessment

## 2014-08-16 NOTE — Transfer of Care (Signed)
Immediate Anesthesia Transfer of Care Note  Patient: Brandy May  Procedure(s) Performed: Procedure(s) (LRB): CYSTOSCOPY WITH URETEROSCOPY, STONE BASKETRY AND STENT PLACEMENT (Right)  Patient Location: PACU  Anesthesia Type: General  Level of Consciousness: awake, oriented, sedated and patient cooperative  Airway & Oxygen Therapy: Patient Spontanous Breathing and Patient connected to face mask oxygen  Post-op Assessment: Report given to PACU RN and Post -op Vital signs reviewed and stable  Post vital signs: Reviewed and stable  Complications: No apparent anesthesia complications

## 2014-08-16 NOTE — Op Note (Signed)
.  Preoperative diagnosis: Right renal stone  Postoperative diagnosis: Same  Procedure: 1 cystoscopy 2 right retrograde pyelography 3.  Intraoperative fluoroscopy, under one hour, with interpretation 4. Right 6 x 26 JJ stent placement  Attending: Rosie Fate  Anesthesia: General  Estimated blood loss: None  Drains: Right 6 x 26 JJ ureteral stent without tether  Specimens: none  Antibiotics: ancef  Findings:  No hydronephrosis, no stone in the ureter. Multiple filling defects in the right kidney. No masses/lesions in the bladder. Ureteral orifices in normal anatomic location.  Indications: Patient is a 59 year old female with a history of right ureteral and renal stone and who has persistent right flank pain.  After discussing treatment options, she decided proceed with right ureteroscopic stone manipulation.  Procedure her in detail: The patient was brought to the operating room and a brief timeout was done to ensure correct patient, correct procedure, correct site.  General anesthesia was administered patient was placed in dorsal lithotomy position.  Her genitalia was then prepped and draped in usual sterile fashion.  A rigid 63 French cystoscope was passed in the urethra and the bladder.  Bladder was inspected free masses or lesions.  the right ureteral orifices were in the normal orthotopic locations.  a 6 french ureteral catheter was then instilled into the right ureter orifice.  a gentle retrograde was obtained and findings noted above.  we then placed a zip wire through the ureteral catheter and advanced up to the renal pelvis.  we then removed the cystoscope and cannulated the right ureteral orifice with a semirigid ureteroscope.  No stone was found in the ureter. Once we reached the UPJ a sensor wire was advanced in to the renal pelvis. We then removed the ureteroscope and attempted advanced a flexible ureteroscope over the sensor wire. We were unable to advance past the mid  ureter.  we then placed a 6 x 26 double-j ureteral stent over the original zip wire.  The wire was removed and good coiling was noted in the renal pelvis under fluoroscopy and in the bladder under direct vision.   the bladder was then drained and this concluded the procedure which was well tolerated by patient.  Complications: None  Condition: Stable, extubated, transferred to PACU  Plan: Patient is to be discharged home as to follow-up in 2 weeks for renal stone extraction.

## 2014-08-17 ENCOUNTER — Encounter (HOSPITAL_BASED_OUTPATIENT_CLINIC_OR_DEPARTMENT_OTHER): Payer: Self-pay | Admitting: Urology

## 2014-08-17 ENCOUNTER — Other Ambulatory Visit: Payer: Self-pay | Admitting: Urology

## 2014-08-17 NOTE — Anesthesia Postprocedure Evaluation (Signed)
  Anesthesia Post-op Note  Patient: Brandy May  Procedure(s) Performed: Procedure(s): CYSTOSCOPY WITH URETEROSCOPY AND STENT PLACEMENT (Right)  Patient Location: PACU  Anesthesia Type:General  Level of Consciousness: awake  Airway and Oxygen Therapy: Patient Spontanous Breathing  Post-op Pain: none  Post-op Assessment: Post-op Vital signs reviewed, Patient's Cardiovascular Status Stable, Respiratory Function Stable, Patent Airway, No signs of Nausea or vomiting and Pain level controlled              Post-op Vital Signs: Reviewed and stable  Last Vitals:  Filed Vitals:   08/16/14 1211  BP: 137/77  Pulse: 67  Temp: 36.5 C  Resp: 12    Complications: No apparent anesthesia complications

## 2014-08-25 ENCOUNTER — Encounter (HOSPITAL_BASED_OUTPATIENT_CLINIC_OR_DEPARTMENT_OTHER): Payer: Self-pay | Admitting: *Deleted

## 2014-08-25 NOTE — Progress Notes (Signed)
NPO AFTER MN.  ARRIVE AT 1015.  CURRENT LAB RESULTS AND EKG IN CHART AND EPIC.  WILL TAKE CRESTOR AND NORVASC AM DOS W/ SIPS OF WATER.

## 2014-08-30 ENCOUNTER — Encounter (HOSPITAL_BASED_OUTPATIENT_CLINIC_OR_DEPARTMENT_OTHER): Payer: Self-pay | Admitting: Anesthesiology

## 2014-08-30 ENCOUNTER — Ambulatory Visit (HOSPITAL_BASED_OUTPATIENT_CLINIC_OR_DEPARTMENT_OTHER): Payer: Commercial Managed Care - PPO | Admitting: Anesthesiology

## 2014-08-30 ENCOUNTER — Ambulatory Visit (HOSPITAL_BASED_OUTPATIENT_CLINIC_OR_DEPARTMENT_OTHER)
Admission: RE | Admit: 2014-08-30 | Discharge: 2014-08-30 | Disposition: A | Discharge status: Home / Self Care | Payer: Commercial Managed Care - PPO | Source: Ambulatory Visit | Attending: Urology | Admitting: Urology

## 2014-08-30 ENCOUNTER — Encounter (HOSPITAL_BASED_OUTPATIENT_CLINIC_OR_DEPARTMENT_OTHER)
Admission: RE | Disposition: A | Discharge status: Home / Self Care | Payer: Self-pay | Source: Ambulatory Visit | Attending: Urology

## 2014-08-30 DIAGNOSIS — N200 Calculus of kidney: Secondary | ICD-10-CM | POA: Diagnosis present

## 2014-08-30 DIAGNOSIS — Z466 Encounter for fitting and adjustment of urinary device: Secondary | ICD-10-CM | POA: Diagnosis not present

## 2014-08-30 DIAGNOSIS — E785 Hyperlipidemia, unspecified: Secondary | ICD-10-CM | POA: Insufficient documentation

## 2014-08-30 DIAGNOSIS — Z888 Allergy status to other drugs, medicaments and biological substances status: Secondary | ICD-10-CM | POA: Diagnosis not present

## 2014-08-30 DIAGNOSIS — I10 Essential (primary) hypertension: Secondary | ICD-10-CM | POA: Diagnosis not present

## 2014-08-30 DIAGNOSIS — Z86718 Personal history of other venous thrombosis and embolism: Secondary | ICD-10-CM | POA: Diagnosis not present

## 2014-08-30 HISTORY — PX: CYSTOSCOPY/RETROGRADE/URETEROSCOPY/STONE EXTRACTION WITH BASKET: SHX5317

## 2014-08-30 HISTORY — PX: CYSTOSCOPY W/ URETERAL STENT PLACEMENT: SHX1429

## 2014-08-30 SURGERY — CYSTOSCOPY, WITH CALCULUS REMOVAL USING BASKET
Anesthesia: General | Laterality: Right

## 2014-08-30 MED ORDER — LACTATED RINGERS IV SOLN
INTRAVENOUS | Status: DC
  Administered 2014-08-30: 11:00:00 via INTRAVENOUS
  Filled 2014-08-30: qty 1000

## 2014-08-30 MED ORDER — FENTANYL CITRATE (PF) 100 MCG/2ML IJ SOLN
25.0000 ug | INTRAMUSCULAR | Status: DC | PRN
  Filled 2014-08-30: qty 1

## 2014-08-30 MED ORDER — PROMETHAZINE HCL 25 MG/ML IJ SOLN
6.2500 mg | INTRAMUSCULAR | Status: DC | PRN
  Filled 2014-08-30: qty 1

## 2014-08-30 MED ORDER — PROPOFOL 10 MG/ML IV BOLUS
INTRAVENOUS | Status: DC | PRN
  Administered 2014-08-30: 200 mg via INTRAVENOUS

## 2014-08-30 MED ORDER — ONDANSETRON HCL 4 MG/2ML IJ SOLN
INTRAMUSCULAR | Status: DC | PRN
  Administered 2014-08-30: 4 mg via INTRAVENOUS

## 2014-08-30 MED ORDER — FENTANYL CITRATE (PF) 100 MCG/2ML IJ SOLN
INTRAMUSCULAR | Status: DC | PRN
Start: 2014-08-30 — End: 2014-08-30
  Administered 2014-08-30 (×2): 50 ug via INTRAVENOUS

## 2014-08-30 MED ORDER — SODIUM CHLORIDE 0.9 % IR SOLN
Status: DC | PRN
  Administered 2014-08-30: 4000 mL via INTRAVESICAL

## 2014-08-30 MED ORDER — CEFTRIAXONE SODIUM 1 G IJ SOLR
INTRAMUSCULAR | Status: AC
  Filled 2014-08-30: qty 10

## 2014-08-30 MED ORDER — LIDOCAINE HCL (CARDIAC) 20 MG/ML IV SOLN
INTRAVENOUS | Status: DC | PRN
  Administered 2014-08-30: 70 mg via INTRAVENOUS

## 2014-08-30 MED ORDER — DEXAMETHASONE SODIUM PHOSPHATE 4 MG/ML IJ SOLN
INTRAMUSCULAR | Status: DC | PRN
  Administered 2014-08-30: 10 mg via INTRAVENOUS

## 2014-08-30 MED ORDER — MIDAZOLAM HCL 5 MG/5ML IJ SOLN
INTRAMUSCULAR | Status: DC | PRN
  Administered 2014-08-30: 2 mg via INTRAVENOUS

## 2014-08-30 MED ORDER — MIDAZOLAM HCL 2 MG/2ML IJ SOLN
INTRAMUSCULAR | Status: AC
  Filled 2014-08-30: qty 2

## 2014-08-30 MED ORDER — OXYCODONE-ACETAMINOPHEN 5-325 MG PO TABS: 1.0000 | ORAL_TABLET | ORAL | Status: DC | PRN

## 2014-08-30 MED ORDER — FENTANYL CITRATE (PF) 100 MCG/2ML IJ SOLN
INTRAMUSCULAR | Status: AC
  Filled 2014-08-30: qty 4

## 2014-08-30 MED ORDER — PHENYLEPHRINE HCL 10 MG/ML IJ SOLN
INTRAMUSCULAR | Status: DC | PRN
  Administered 2014-08-30: 80 ug via INTRAVENOUS
  Administered 2014-08-30: 120 ug via INTRAVENOUS
  Administered 2014-08-30: 80 ug via INTRAVENOUS
  Administered 2014-08-30: 120 ug via INTRAVENOUS

## 2014-08-30 MED ORDER — EPHEDRINE SULFATE 50 MG/ML IJ SOLN
INTRAMUSCULAR | Status: DC | PRN
  Administered 2014-08-30 (×2): 10 mg via INTRAVENOUS

## 2014-08-30 MED ORDER — DEXTROSE 5 % IV SOLN
1.0000 g | INTRAVENOUS | Status: AC
  Administered 2014-08-30: 1 g via INTRAVENOUS
  Filled 2014-08-30: qty 10

## 2014-08-30 MED ORDER — SULFAMETHOXAZOLE-TRIMETHOPRIM 800-160 MG PO TABS: 1.0000 | ORAL_TABLET | Freq: Two times a day (BID) | ORAL | Status: DC

## 2014-08-30 MED ORDER — KETOROLAC TROMETHAMINE 30 MG/ML IJ SOLN
INTRAMUSCULAR | Status: DC | PRN
  Administered 2014-08-30: 30 mg via INTRAVENOUS

## 2014-08-30 MED ORDER — IOHEXOL 350 MG/ML SOLN
INTRAVENOUS | Status: DC | PRN
  Administered 2014-08-30: 4 mL via URETHRAL

## 2014-08-30 SURGICAL SUPPLY — 27 items
BAG URO CATCHER STRL LF (DRAPE) ×2 IMPLANT
BASKET LASER NITINOL 1.9FR (BASKET) IMPLANT
BASKET STONE 1.7 NGAGE (UROLOGICAL SUPPLIES) ×1 IMPLANT
BASKET ZERO TIP NITINOL 2.4FR (BASKET) IMPLANT
BSKT STON RTRVL 120 1.9FR (BASKET)
BSKT STON RTRVL ZERO TP 2.4FR (BASKET)
CANISTER SUCT LVC 12 LTR MEDI- (MISCELLANEOUS) IMPLANT
CATH INTERMIT  6FR 70CM (CATHETERS) IMPLANT
CLOTH BEACON ORANGE TIMEOUT ST (SAFETY) ×2 IMPLANT
FIBER LASER FLEXIVA 365 (UROLOGICAL SUPPLIES) IMPLANT
FIBER LASER TRAC TIP (UROLOGICAL SUPPLIES) IMPLANT
GLOVE BIO SURGEON STRL SZ8 (GLOVE) ×2 IMPLANT
GOWN STRL REUS W/ TWL LRG LVL3 (GOWN DISPOSABLE) ×1 IMPLANT
GOWN STRL REUS W/ TWL XL LVL3 (GOWN DISPOSABLE) ×1 IMPLANT
GOWN STRL REUS W/TWL LRG LVL3 (GOWN DISPOSABLE) ×2
GOWN STRL REUS W/TWL XL LVL3 (GOWN DISPOSABLE) ×2
GUIDEWIRE ANG ZIPWIRE 038X150 (WIRE) ×2 IMPLANT
GUIDEWIRE STR DUAL SENSOR (WIRE) ×1 IMPLANT
IV NS 1000ML (IV SOLUTION) ×2
IV NS 1000ML BAXH (IV SOLUTION) IMPLANT
IV NS IRRIG 3000ML ARTHROMATIC (IV SOLUTION) ×2 IMPLANT
MANIFOLD NEPTUNE II (INSTRUMENTS) ×1 IMPLANT
PACK CYSTO (CUSTOM PROCEDURE TRAY) ×2 IMPLANT
SHEATH ACCESS URETERAL 38CM (SHEATH) ×1 IMPLANT
STENT URET 6FRX26 CONTOUR (STENTS) ×1 IMPLANT
SYRINGE 10CC LL (SYRINGE) ×2 IMPLANT
TUBE FEEDING 8FR 16IN STR KANG (MISCELLANEOUS) IMPLANT

## 2014-08-30 NOTE — Discharge Instructions (Signed)

## 2014-08-30 NOTE — Anesthesia Preprocedure Evaluation (Addendum)
Anesthesia Evaluation  Patient identified by MRN, date of birth, ID band Patient awake    Reviewed: Allergy & Precautions, NPO status , Patient's Chart, lab work & pertinent test results  Airway Mallampati: II  TM Distance: >3 FB Neck ROM: Full    Dental no notable dental hx. (+) Teeth Intact, Dental Advisory Given   Pulmonary neg pulmonary ROS,  breath sounds clear to auscultation  Pulmonary exam normal       Cardiovascular Exercise Tolerance: Good hypertension, Pt. on medications Normal cardiovascular examRhythm:Regular Rate:Normal     Neuro/Psych negative neurological ROS  negative psych ROS   GI/Hepatic negative GI ROS, Neg liver ROS,   Endo/Other  negative endocrine ROS  Renal/GU Renal disease  negative genitourinary   Musculoskeletal negative musculoskeletal ROS (+)   Abdominal   Peds negative pediatric ROS (+)  Hematology negative hematology ROS (+)   Anesthesia Other Findings   Reproductive/Obstetrics negative OB ROS                          Anesthesia Physical Anesthesia Plan  ASA: II  Anesthesia Plan: General   Post-op Pain Management:    Induction: Intravenous  Airway Management Planned: LMA  Additional Equipment:   Intra-op Plan:   Post-operative Plan: Extubation in OR  Informed Consent: I have reviewed the patients History and Physical, chart, labs and discussed the procedure including the risks, benefits and alternatives for the proposed anesthesia with the patient or authorized representative who has indicated his/her understanding and acceptance.   Dental advisory given  Plan Discussed with: CRNA  Anesthesia Plan Comments:         Anesthesia Quick Evaluation

## 2014-08-30 NOTE — Anesthesia Procedure Notes (Signed)
Procedure Name: LMA Insertion Date/Time: 08/30/2014 2:04 PM Performed by: Wanita Chamberlain Pre-anesthesia Checklist: Patient identified, Timeout performed, Emergency Drugs available, Suction available and Patient being monitored Patient Re-evaluated:Patient Re-evaluated prior to inductionOxygen Delivery Method: Circle system utilized Preoxygenation: Pre-oxygenation with 100% oxygen Intubation Type: IV induction Ventilation: Mask ventilation without difficulty LMA: LMA inserted LMA Size: 4.0 Number of attempts: 1 Airway Equipment and Method: Bite block Placement Confirmation: positive ETCO2 and breath sounds checked- equal and bilateral Tube secured with: Tape Dental Injury: Teeth and Oropharynx as per pre-operative assessment

## 2014-08-30 NOTE — Transfer of Care (Signed)
Immediate Anesthesia Transfer of Care Note  Patient: Brandy May  Procedure(s) Performed: Procedure(s): CYSTOSCOPY/RETROGRADE/URETEROSCOPY/STONE EXTRACTION   (Right) CYSTOSCOPY WITH STENT REPLACEMENT (Right)  Patient Location: PACU  Anesthesia Type:General  Level of Consciousness: awake, alert , oriented and patient cooperative  Airway & Oxygen Therapy: Patient Spontanous Breathing and Patient connected to nasal cannula oxygen  Post-op Assessment: Report given to RN and Post -op Vital signs reviewed and stable  Post vital signs: Reviewed and stable  Last Vitals:  Filed Vitals:   08/30/14 1026  BP: 117/75  Pulse: 84  Temp: 36.9 C  Resp: 16    Complications: No apparent anesthesia complications

## 2014-08-30 NOTE — Brief Op Note (Signed)
08/30/2014  2:55 PM  PATIENT:  Brandy May  59 y.o. female  PRE-OPERATIVE DIAGNOSIS:  RIGHT RENAL STONE  POST-OPERATIVE DIAGNOSIS:  RIGHT RENAL STONE  PROCEDURE:  Procedure(s): CYSTOSCOPY/RETROGRADE/URETEROSCOPY/STONE EXTRACTION   (Right) CYSTOSCOPY WITH STENT REPLACEMENT (Right)  SURGEON:  Surgeon(s) and Role:    * Cleon Gustin, MD - Primary  PHYSICIAN ASSISTANT:   ASSISTANTS: none   ANESTHESIA:   general  EBL:   minimal  BLOOD ADMINISTERED:none  DRAINS: 6x26 JJ ureteral stent with tether  LOCAL MEDICATIONS USED:  NONE  SPECIMEN:  Source of Specimen:  stone  DISPOSITION OF SPECIMEN:  N/A  COUNTS:  YES  TOURNIQUET:  * No tourniquets in log *  DICTATION: .Note written in EPIC  PLAN OF CARE: Discharge to home after PACU  PATIENT DISPOSITION:  PACU - hemodynamically stable.   Delay start of Pharmacological VTE agent (>24hrs) due to surgical blood loss or risk of bleeding: not applicable

## 2014-08-31 NOTE — Anesthesia Postprocedure Evaluation (Signed)
  Anesthesia Post-op Note  Patient: Brandy May  Procedure(s) Performed: Procedure(s) (LRB): CYSTOSCOPY/RETROGRADE/URETEROSCOPY/STONE EXTRACTION   (Right) CYSTOSCOPY WITH STENT REPLACEMENT (Right)  Patient Location: PACU  Anesthesia Type: General  Level of Consciousness: awake and alert   Airway and Oxygen Therapy: Patient Spontanous Breathing  Post-op Pain: mild  Post-op Assessment: Post-op Vital signs reviewed, Patient's Cardiovascular Status Stable, Respiratory Function Stable, Patent Airway and No signs of Nausea or vomiting  Last Vitals:  Filed Vitals:   08/30/14 1659  BP: 126/73  Pulse: 86  Temp: 36.4 C  Resp: 17    Post-op Vital Signs: stable   Complications: No apparent anesthesia complications

## 2014-09-01 ENCOUNTER — Encounter (HOSPITAL_BASED_OUTPATIENT_CLINIC_OR_DEPARTMENT_OTHER): Payer: Self-pay | Admitting: Urology

## 2014-09-01 NOTE — Op Note (Signed)
Preoperative diagnosis: Right renal stone  Postoperative diagnosis: Same  Procedure: 1 cystoscopy 2. right retrograde pyelography 3.  Intraoperative fluoroscopy, under one hour, with interpretation 4.  Right ureteroscopic stone manipulation with basket extraction 5.  Right 6 x 26 JJ stent exchange  Attending: Rosie Fate  Anesthesia: General  Estimated blood loss: None  Drains: Right 6 x 26 JJ ureteral stent with tether  Specimens: stone for analysis  Antibiotics: rocephin  Findings: Right embedded upper pole stone. No lower pole stone identified. No hydronephrosis. No masses/lesions in the bladder. Ureteral orifices in normal anatomic location.  Indications: Patient is a 59 year old female with a history of right renal stone and who has persistent right flank pain.  After discussing treatment options, she decided proceed with right ureteroscopic stone manipulation.  Procedure her in detail: The patient was brought to the operating room and a brief timeout was done to ensure correct patient, correct procedure, correct site.  General anesthesia was administered patient was placed in dorsal lithotomy position.  Her genitalia was then prepped and draped in usual sterile fashion.  A rigid 81 French cystoscope was passed in the urethra and the bladder.  Bladder was inspected free masses or lesions.  the ureteral orifices were in the normal orthotopic locations. Using a grasper the right ureteral stent was brought to the urethal meatus.   A zip wire was then advanced through the stent up to the renal pelvis. The stent was then removed. a 6 french ureteral catheter was then instilled into the right ureter orifice.  a gentle retrograde was obtained and findings noted above.we then removed the cystoscope and cannulated the right ureteral orifice with a semirigid ureteroscope.  No stone was found in the ureter. Once we reached the UPJ a sensor wire was advanced in to the renal pelvis. We then  removed the ureteroscope and advanced an access sheath up the the renal pelvis. A flexible ureteroscope was then used to perform nephroscopy. We encountered the stone in the upper pole. Using an Ngage basket the stone was extracted from the renal tissue. Once the stone was removed we then removed the access sheath under direct vision. we then placed a 6 x 26 double-j ureteral stent over the original zip wire. We then removed the wire and good coil was noted in the the renal pelvis under fluoroscopy and the bladder under direct vision. the bladder was then drained and this concluded the procedure which was well tolerated by patient.  Complications: None  Condition: Stable, extubated, transferred to PACU  Plan: Patient is to be discharged home as to follow-up in one week for stone composition. She is to remove her stent by pulling string in 48 hours.

## 2014-09-04 NOTE — H&P (Signed)
Urology Admission H&P  Chief Complaint: right flank pain  History of Present Illness: [name] is a 59yo with hx of right flank pain who was diagnosed with a right ureteral and multiple renal stones. She is s/p stent placement. She presents today for ureteroscopy  Past Medical History  Diagnosis Date  . Hyperlipidemia   . Hypertension   . History of DVT of lower extremity 1991    w/ second pregancy  . History of peptic ulcer   . Renal calculi     bilateral --  R > L  . Wears glasses    Past Surgical History  Procedure Laterality Date  . Tubal ligation  1994  . Cesarean section  [place]  . Knee arthroscopy Right 07-19-2010  . Right thumb trigger release  2014  . Cystoscopy with ureteroscopy and stent placement Right 08/16/2014    Procedure: CYSTOSCOPY WITH URETEROSCOPY AND STENT PLACEMENT;  Surgeon: Cleon Gustin, MD;  Location[facility];  Service: Urology;  Laterality: Right;  . Cystoscopy/retrograde/ureteroscopy/stone extraction with basket Right 08/30/2014    Procedure: CYSTOSCOPY/RETROGRADE/URETEROSCOPY/STONE EXTRACTION  ;  Surgeon: Cleon Gustin, MD;  Location: [facility];  Service: Urology;  Laterality: Right;  . Cystoscopy w/ ureteral stent placement Right 08/30/2014    Procedure: CYSTOSCOPY WITH STENT REPLACEMENT;  Surgeon: Cleon Gustin, MD;  Location: [facility];  Service: Urology;  Laterality: Right;    Home Medications:  No prescriptions prior to admission   Allergies:  Allergies  Allergen Reactions  . Coumadin [Warfarin Sodium] Itching    Severe Itching     Family History  Problem Relation Age of Onset  . Arthritis Mother   . Hyperlipidemia Mother   . Hypertension Mother   . Cancer Father   . Hyperlipidemia Sister    Social History:  reports that she has never smoked. She has never used smokeless tobacco. She reports that she does not drink alcohol or use illicit drugs.  Review of Systems   All other systems reviewed and are negative.   Physical Exam:  Vital signs in last 24 hours:   Physical Exam  Constitutional: She is oriented to person, place, and time. She appears well-developed and well-nourished.  HENT:  Head: Normocephalic and atraumatic.  Eyes: EOM are normal. Pupils are equal, round, and reactive to light.  Neck: Normal range of motion. Neck supple.  Cardiovascular: Normal rate and regular rhythm.   Respiratory: Effort normal and breath sounds normal.  GI: Soft. Bowel sounds are normal.  Musculoskeletal: Normal range of motion.  Neurological: She is alert and oriented to person, place, and time.  Skin: Skin is warm and dry.  Psychiatric: She has a normal mood and affect. Her behavior is normal. Judgment and thought content normal.    Laboratory Data:  No results found for this or any previous visit (from the past 24 hour(s)). No results found for this or any previous visit (from the past 240 hour(s)). Creatinine: No results for input(s): CREATININE in the last 168 hours.   Impression/Assessment:  Right renal stone  Plan:  Risks/benefits/alternatives to right stone extraction was explained to the patient and she understands and wishes to proceed with surgery.  MCKENZIE, PATRICK L 09/04/2014, 9:06 AM

## 2014-10-11 ENCOUNTER — Telehealth: Payer: Self-pay | Admitting: Behavioral Health

## 2014-10-11 ENCOUNTER — Encounter: Payer: Self-pay | Admitting: Behavioral Health

## 2014-10-11 NOTE — Telephone Encounter (Signed)
Pre-Visit Call completed with patient and chart updated.   Pre-Visit Info documented in Specialty Comments under [employer].    

## 2014-10-12 ENCOUNTER — Encounter: Payer: Self-pay | Admitting: Family Medicine

## 2014-10-12 ENCOUNTER — Ambulatory Visit (INDEPENDENT_AMBULATORY_CARE_PROVIDER_SITE_OTHER): Payer: Commercial Managed Care - PPO | Admitting: Family Medicine

## 2014-10-12 VITALS — BP 128/80 | HR 83 | Temp 98.1°F | Resp 16 | Ht 65.0 in | Wt 164.0 lb

## 2014-10-12 DIAGNOSIS — M25562 Pain in left knee: Secondary | ICD-10-CM

## 2014-10-12 DIAGNOSIS — Z0000 Encounter for general adult medical examination without abnormal findings: Secondary | ICD-10-CM | POA: Diagnosis not present

## 2014-10-12 DIAGNOSIS — Z23 Encounter for immunization: Secondary | ICD-10-CM

## 2014-10-12 LAB — HEPATIC FUNCTION PANEL
ALT: 14 U/L (ref 0–35)
AST: 16 U/L (ref 0–37)
Albumin: 4.1 g/dL (ref 3.5–5.2)
Alkaline Phosphatase: 66 U/L (ref 39–117)
BILIRUBIN TOTAL: 0.7 mg/dL (ref 0.2–1.2)
Bilirubin, Direct: 0.1 mg/dL (ref 0.0–0.3)
TOTAL PROTEIN: 7.4 g/dL (ref 6.0–8.3)

## 2014-10-12 LAB — BASIC METABOLIC PANEL
BUN: 15 mg/dL (ref 6–23)
CALCIUM: 9.5 mg/dL (ref 8.4–10.5)
CO2: 30 mEq/L (ref 19–32)
CREATININE: 0.77 mg/dL (ref 0.40–1.20)
Chloride: 106 mEq/L (ref 96–112)
GFR: 99.17 mL/min (ref >60.00)
Glucose, Bld: 94 mg/dL (ref 70–99)
Potassium: 3.7 mEq/L (ref 3.5–5.1)
Sodium: 142 mEq/L (ref 135–145)

## 2014-10-12 LAB — TSH: TSH: 1.21 u[IU]/mL (ref 0.35–4.50)

## 2014-10-12 LAB — CBC WITH DIFFERENTIAL/PLATELET
BASOS ABS: 0 10*3/uL (ref 0.0–0.1)
Basophils Relative: 1 % (ref 0.0–3.0)
Eosinophils Absolute: 0.1 10*3/uL (ref 0.0–0.7)
Eosinophils Relative: 4.8 % (ref 0.0–5.0)
HEMATOCRIT: 39.8 % (ref 36.0–46.0)
Hemoglobin: 13.3 g/dL (ref 12.0–15.0)
LYMPHS PCT: 43.3 % (ref 12.0–46.0)
Lymphs Abs: 1.2 10*3/uL (ref 0.7–4.0)
MCHC: 33.3 g/dL (ref 30.0–36.0)
MCV: 97.7 fl (ref 78.0–100.0)
MONOS PCT: 8 % (ref 3.0–12.0)
Monocytes Absolute: 0.2 10*3/uL (ref 0.1–1.0)
NEUTROS ABS: 1.2 10*3/uL — AB (ref 1.4–7.7)
Neutrophils Relative %: 42.9 % — ABNORMAL LOW (ref 43.0–77.0)
PLATELETS: 213 10*3/uL (ref 150.0–400.0)
RBC: 4.08 Mil/uL (ref 3.87–5.11)
RDW: 13.3 % (ref 11.5–15.5)
WBC: 2.8 10*3/uL — AB (ref 4.0–10.5)

## 2014-10-12 LAB — VITAMIN D 25 HYDROXY (VIT D DEFICIENCY, FRACTURES): VITD: 31.2 ng/mL (ref 30.00–100.00)

## 2014-10-12 LAB — LIPID PANEL
Cholesterol: 136 mg/dL (ref 0–200)
HDL: 51.6 mg/dL (ref >39.00)
LDL CALC: 73 mg/dL (ref 0–99)
NONHDL: 84.54
Total CHOL/HDL Ratio: 3
Triglycerides: 56 mg/dL (ref 0.0–149.0)
VLDL: 11.2 mg/dL (ref 0.0–40.0)

## 2014-10-12 NOTE — Progress Notes (Signed)
   Subjective:    Patient ID: Brandy May, female    DOB: 1956/01/20, 59 y.o.   MRN: [id number]  HPI CPE- pt UTD on GYN (Cousins-appt next month).  UTD on colonoscopy (due 2019 w/ Dr Earlean Shawl)   Review of Systems Patient reports no vision/ hearing changes, adenopathy,fever, weight change,  persistant/recurrent hoarseness , swallowing issues, chest pain, palpitations, edema, persistant/recurrent cough, hemoptysis, dyspnea (rest/exertional/paroxysmal nocturnal), gastrointestinal bleeding (melena, rectal bleeding), abdominal pain, significant heartburn, bowel changes, GU symptoms (dysuria, hematuria, incontinence), Gyn symptoms (abnormal  bleeding, pain),  syncope, focal weakness, memory loss, numbness & tingling, skin/hair/nail changes, abnormal bruising or bleeding, anxiety, or depression.   L knee pain- pt is interested in ortho referral ([facility])    Objective:   Physical Exam General Appearance:    Alert, cooperative, no distress, appears stated age  Head:    Normocephalic, without obvious abnormality, atraumatic  Eyes:    PERRL, conjunctiva/corneas clear, EOM's intact, fundi    benign, both eyes  Ears:    Normal TM's and external ear canals, both ears  Nose:   Nares normal, septum midline, mucosa normal, no drainage    or sinus tenderness  Throat:   Lips, mucosa, and tongue normal; teeth and gums normal  Neck:   Supple, symmetrical, trachea midline, no adenopathy;    Thyroid: no enlargement/tenderness/nodules  Back:     Symmetric, no curvature, ROM normal, no CVA tenderness  Lungs:     Clear to auscultation bilaterally, respirations unlabored  Chest Wall:    No tenderness or deformity   Heart:    Regular rate and rhythm, S1 and S2 normal, no murmur, rub   or gallop  Breast Exam:    Deferred to GYN  Abdomen:     Soft, non-tender, bowel sounds active all four quadrants,    no masses, no organomegaly  Genitalia:    Deferred to GYN  Rectal:    Extremities:   Extremities normal,  atraumatic, no cyanosis or edema  Pulses:   2+ and symmetric all extremities  Skin:   Skin color, texture, turgor normal, no rashes or lesions  Lymph nodes:   Cervical, supraclavicular, and axillary nodes normal  Neurologic:   CNII-XII intact, normal strength, sensation and reflexes    throughout          Assessment & Plan:

## 2014-10-12 NOTE — Progress Notes (Signed)
Pre visit review using our clinic review tool, if applicable. No additional management support is needed unless otherwise documented below in the visit note. 

## 2014-10-12 NOTE — Assessment & Plan Note (Signed)
Pt's PE WNL.  GYN exam scheduled.  UTD on colonoscopy.  Encouraged healthy diet and regular exercise.  Check labs.  Flu shot given.  Anticipatory guidance provided.

## 2014-10-12 NOTE — Patient Instructions (Signed)
Follow up in 6 months to recheck BP and cholesterol We'll notify you of your lab results and make any changes if needed Continue to work on healthy diet and regular exercise- you look great! Call with any questions or concerns If you want to join [place] at the new [place] office, any scheduled appointments will automatically transfer and we will see you at [address] [place] [address] [place], [place] [address]  Have a wonderful trip to [place]!!

## 2014-10-30 ENCOUNTER — Other Ambulatory Visit: Payer: Self-pay

## 2014-10-30 DIAGNOSIS — Z1231 Encounter for screening mammogram for malignant neoplasm of breast: Secondary | ICD-10-CM

## 2014-11-15 ENCOUNTER — Ambulatory Visit
Admission: RE | Admit: 2014-11-15 | Discharge: 2014-11-15 | Disposition: A | Discharge status: Home / Self Care | Payer: Commercial Managed Care - PPO | Source: Ambulatory Visit

## 2014-11-15 DIAGNOSIS — Z1231 Encounter for screening mammogram for malignant neoplasm of breast: Secondary | ICD-10-CM

## 2014-11-18 LAB — HM PAP SMEAR

## 2014-11-18 LAB — HM MAMMOGRAPHY: HM Mammogram: NORMAL (ref 0–4)

## 2015-04-13 ENCOUNTER — Ambulatory Visit (INDEPENDENT_AMBULATORY_CARE_PROVIDER_SITE_OTHER): Payer: Commercial Managed Care - PPO | Admitting: Family Medicine

## 2015-04-13 ENCOUNTER — Encounter: Payer: Self-pay | Admitting: Family Medicine

## 2015-04-13 VITALS — BP 130/80 | HR 79 | Temp 98.2°F | Resp 17 | Ht 65.0 in | Wt 164.2 lb

## 2015-04-13 DIAGNOSIS — E785 Hyperlipidemia, unspecified: Secondary | ICD-10-CM

## 2015-04-13 DIAGNOSIS — I10 Essential (primary) hypertension: Secondary | ICD-10-CM | POA: Diagnosis not present

## 2015-04-13 LAB — CBC WITH DIFFERENTIAL/PLATELET
BASOS ABS: 0 10*3/uL (ref 0.0–0.1)
BASOS PCT: 0.7 % (ref 0.0–3.0)
EOS PCT: 3.4 % (ref 0.0–5.0)
Eosinophils Absolute: 0.1 10*3/uL (ref 0.0–0.7)
HCT: 40.7 % (ref 36.0–46.0)
Hemoglobin: 13.3 g/dL (ref 12.0–15.0)
LYMPHS ABS: 1.4 10*3/uL (ref 0.7–4.0)
Lymphocytes Relative: 54 % — ABNORMAL HIGH (ref 12.0–46.0)
MCHC: 32.7 g/dL (ref 30.0–36.0)
MCV: 97.7 fl (ref 78.0–100.0)
MONOS PCT: 7.1 % (ref 3.0–12.0)
Monocytes Absolute: 0.2 10*3/uL (ref 0.1–1.0)
NEUTROS ABS: 0.9 10*3/uL — AB (ref 1.4–7.7)
NEUTROS PCT: 34.8 % — AB (ref 43.0–77.0)
PLATELETS: 184 10*3/uL (ref 150.0–400.0)
RBC: 4.17 Mil/uL (ref 3.87–5.11)
RDW: 13.7 % (ref 11.5–15.5)
WBC: 2.5 10*3/uL — ABNORMAL LOW (ref 4.0–10.5)

## 2015-04-13 LAB — BASIC METABOLIC PANEL
BUN: 14 mg/dL (ref 6–23)
CHLORIDE: 107 meq/L (ref 96–112)
CO2: 29 mEq/L (ref 19–32)
Calcium: 9.5 mg/dL (ref 8.4–10.5)
Creatinine, Ser: 0.87 mg/dL (ref 0.40–1.20)
GFR: 85.98 mL/min (ref >60.00)
GLUCOSE: 90 mg/dL (ref 70–99)
POTASSIUM: 3.8 meq/L (ref 3.5–5.1)
SODIUM: 142 meq/L (ref 135–145)

## 2015-04-13 LAB — HEPATIC FUNCTION PANEL
ALK PHOS: 55 U/L (ref 39–117)
ALT: 14 U/L (ref 0–35)
AST: 18 U/L (ref 0–37)
Albumin: 4.1 g/dL (ref 3.5–5.2)
BILIRUBIN TOTAL: 0.6 mg/dL (ref 0.2–1.2)
Bilirubin, Direct: 0.2 mg/dL (ref 0.0–0.3)
Total Protein: 7.4 g/dL (ref 6.0–8.3)

## 2015-04-13 LAB — LIPID PANEL
CHOLESTEROL: 160 mg/dL (ref 0–200)
HDL: 50.1 mg/dL (ref >39.00)
LDL CALC: 101 mg/dL — AB (ref 0–99)
NonHDL: 109.78
Total CHOL/HDL Ratio: 3
Triglycerides: 46 mg/dL (ref 0.0–149.0)
VLDL: 9.2 mg/dL (ref 0.0–40.0)

## 2015-04-13 NOTE — Progress Notes (Signed)
Pre visit review using our clinic review tool, if applicable. No additional management support is needed unless otherwise documented below in the visit note. 

## 2015-04-13 NOTE — Patient Instructions (Signed)
Schedule your complete physical in 6 months We'll notify you of your lab results and make any changes if needed Keep up the good work on healthy diet and regular exercise- you look great! Call with any questions or concerns If you want to join [place] at the new [place] office, any scheduled appointments will automatically transfer and we will see you at [address] [place] [address] [place], [place] [id number] (OPENING 3/23) Happy [place]!!!

## 2015-04-13 NOTE — Assessment & Plan Note (Signed)
Chronic problem.  Adequate control.  Asymptomatic.  Check labs.  No anticipated med changes 

## 2015-04-13 NOTE — Progress Notes (Signed)
   Subjective:    Patient ID: Brandy May, female    DOB: March 06, 1955, 60 y.o.   MRN: [id number]  HPI HTN- chronic problem, on Amlodipine w/ good BP control.  Denies CP, SOB, HAs, visual changes, edema.  Hyperlipidemia- chronic problem, on Crestor.  Pt has gained 6 lbs since last visit.  Walking dog daily for at least 20 minutes.  Denies abd pain, N/V, myalgias.   Review of Systems For ROS see HPI     Objective:   Physical Exam  Constitutional: She is oriented to person, place, and time. She appears well-developed and well-nourished. No distress.  HENT:  Head: Normocephalic and atraumatic.  Eyes: Conjunctivae and EOM are normal. Pupils are equal, round, and reactive to light.  Neck: Normal range of motion. Neck supple. Thyromegaly present.  Cardiovascular: Normal rate, regular rhythm, normal heart sounds and intact distal pulses.   No murmur heard. Pulmonary/Chest: Effort normal and breath sounds normal. No respiratory distress.  Abdominal: Soft. She exhibits no distension. There is no tenderness.  Musculoskeletal: She exhibits no edema.  Lymphadenopathy:    She has no cervical adenopathy.  Neurological: She is alert and oriented to person, place, and time.  Skin: Skin is warm and dry.  Psychiatric: She has a normal mood and affect. Her behavior is normal.  Vitals reviewed.         Assessment & Plan:

## 2015-04-13 NOTE — Assessment & Plan Note (Signed)
Chronic problem.  Tolerating statin w/o difficulty.  Check labs.  Adjust meds prn  

## 2015-04-23 ENCOUNTER — Other Ambulatory Visit: Payer: Self-pay | Admitting: Family Medicine

## 2015-04-24 NOTE — Telephone Encounter (Signed)
Medication filled to pharmacy as requested.   

## 2015-10-19 ENCOUNTER — Ambulatory Visit (INDEPENDENT_AMBULATORY_CARE_PROVIDER_SITE_OTHER): Payer: Commercial Managed Care - PPO | Admitting: Family Medicine

## 2015-10-19 ENCOUNTER — Encounter: Payer: Self-pay | Admitting: Family Medicine

## 2015-10-19 VITALS — BP 128/83 | HR 88 | Temp 98.2°F | Resp 16 | Ht 65.0 in | Wt 160.2 lb

## 2015-10-19 DIAGNOSIS — Z23 Encounter for immunization: Secondary | ICD-10-CM

## 2015-10-19 DIAGNOSIS — Z0000 Encounter for general adult medical examination without abnormal findings: Secondary | ICD-10-CM

## 2015-10-19 LAB — BASIC METABOLIC PANEL
BUN: 12 mg/dL (ref 6–23)
CO2: 30 mEq/L (ref 19–32)
CREATININE: 0.87 mg/dL (ref 0.40–1.20)
Calcium: 9.4 mg/dL (ref 8.4–10.5)
Chloride: 105 mEq/L (ref 96–112)
GFR: 85.83 mL/min (ref >60.00)
Glucose, Bld: 88 mg/dL (ref 70–99)
POTASSIUM: 4.1 meq/L (ref 3.5–5.1)
Sodium: 141 mEq/L (ref 135–145)

## 2015-10-19 LAB — CBC WITH DIFFERENTIAL/PLATELET
Basophils Absolute: 0 10*3/uL (ref 0.0–0.1)
Basophils Relative: 0.6 % (ref 0.0–3.0)
Eosinophils Absolute: 0.1 10*3/uL (ref 0.0–0.7)
Eosinophils Relative: 3 % (ref 0.0–5.0)
HCT: 41.3 % (ref 36.0–46.0)
Hemoglobin: 13.9 g/dL (ref 12.0–15.0)
Lymphocytes Relative: 53.7 % — ABNORMAL HIGH (ref 12.0–46.0)
Lymphs Abs: 1.3 10*3/uL (ref 0.7–4.0)
MCHC: 33.7 g/dL (ref 30.0–36.0)
MCV: 96.1 fl (ref 78.0–100.0)
Monocytes Absolute: 0.2 10*3/uL (ref 0.1–1.0)
Monocytes Relative: 9.1 % (ref 3.0–12.0)
Neutro Abs: 0.8 10*3/uL — ABNORMAL LOW (ref 1.4–7.7)
Neutrophils Relative %: 33.6 % — ABNORMAL LOW (ref 43.0–77.0)
Platelets: 194 10*3/uL (ref 150.0–400.0)
RBC: 4.3 Mil/uL (ref 3.87–5.11)
RDW: 13.2 % (ref 11.5–15.5)
WBC: 2.3 10*3/uL — ABNORMAL LOW (ref 4.0–10.5)

## 2015-10-19 LAB — HEPATIC FUNCTION PANEL
ALT: 14 U/L (ref 0–35)
AST: 19 U/L (ref 0–37)
Albumin: 4 g/dL (ref 3.5–5.2)
Alkaline Phosphatase: 54 U/L (ref 39–117)
Bilirubin, Direct: 0.1 mg/dL (ref 0.0–0.3)
Total Bilirubin: 0.7 mg/dL (ref 0.2–1.2)
Total Protein: 7.5 g/dL (ref 6.0–8.3)

## 2015-10-19 LAB — LIPID PANEL
CHOL/HDL RATIO: 3
Cholesterol: 143 mg/dL (ref 0–200)
HDL: 47.2 mg/dL (ref >39.00)
LDL CALC: 85 mg/dL (ref 0–99)
NONHDL: 96.09
TRIGLYCERIDES: 56 mg/dL (ref 0.0–149.0)
VLDL: 11.2 mg/dL (ref 0.0–40.0)

## 2015-10-19 LAB — TSH: TSH: 0.6 u[IU]/mL (ref 0.35–4.50)

## 2015-10-19 LAB — VITAMIN D 25 HYDROXY (VIT D DEFICIENCY, FRACTURES): VITD: 20.35 ng/mL — ABNORMAL LOW (ref 30.00–100.00)

## 2015-10-19 NOTE — Progress Notes (Signed)
Pre visit review using our clinic review tool, if applicable. No additional management support is needed unless otherwise documented below in the visit note. 

## 2015-10-19 NOTE — Patient Instructions (Addendum)
Follow up in 6 months to recheck BP and cholesterol We'll notify you of your lab results and make any changes if needed Continue to work on healthy diet and regular exercise- you look great! Call with any questions or concerns Happy Fall! 

## 2015-10-19 NOTE — Assessment & Plan Note (Signed)
Pt's PE WNL.  UTD on GYN, colonoscopy.  Flu shot given today.  Check labs.  Anticipatory guidance provided.

## 2015-10-19 NOTE — Progress Notes (Signed)
   Subjective:    Patient ID: Brandy May, female    DOB: [birth year], 60 y.o.   MRN: [id number]  HPI CPE- UTD on pap, mammo (due Oct w/ Dr Garwin Brothers).  UTD on colonoscopy (due to 2019)   Review of Systems Patient reports no vision/ hearing changes, adenopathy,fever, weight change,  persistant/recurrent hoarseness , swallowing issues, chest pain, palpitations, edema, persistant/recurrent cough, hemoptysis, dyspnea (rest/exertional/paroxysmal nocturnal), gastrointestinal bleeding (melena, rectal bleeding), abdominal pain, significant heartburn, bowel changes, GU symptoms (dysuria, hematuria, incontinence), Gyn symptoms (abnormal  bleeding, pain),  syncope, focal weakness, memory loss, numbness & tingling, skin/hair/nail changes, abnormal bruising or bleeding, anxiety, or depression.     Objective:   Physical Exam General Appearance:    Alert, cooperative, no distress, appears stated age  Head:    Normocephalic, without obvious abnormality, atraumatic  Eyes:    PERRL, conjunctiva/corneas clear, EOM's intact, fundi    benign, both eyes  Ears:    Normal TM's and external ear canals, both ears  Nose:   Nares normal, septum midline, mucosa normal, no drainage    or sinus tenderness  Throat:   Lips, mucosa, and tongue normal; teeth and gums normal  Neck:   Supple, symmetrical, trachea midline, no adenopathy;    Thyroid: no enlargement/tenderness/nodules  Back:     Symmetric, no curvature, ROM normal, no CVA tenderness  Lungs:     Clear to auscultation bilaterally, respirations unlabored  Chest Wall:    No tenderness or deformity   Heart:    Regular rate and rhythm, S1 and S2 normal, no murmur, rub   or gallop  Breast Exam:    Deferred to GYN  Abdomen:     Soft, non-tender, bowel sounds active all four quadrants,    no masses, no organomegaly  Genitalia:    Deferred to GYN  Rectal:    Extremities:   Extremities normal, atraumatic, no cyanosis or edema  Pulses:   2+ and symmetric all  extremities  Skin:   Skin color, texture, turgor normal, no rashes or lesions  Lymph nodes:   Cervical, supraclavicular, and axillary nodes normal  Neurologic:   CNII-XII intact, normal strength, sensation and reflexes    throughout          Assessment & Plan:

## 2015-10-22 ENCOUNTER — Other Ambulatory Visit: Payer: Self-pay | Admitting: General Practice

## 2015-10-22 MED ORDER — VITAMIN D (ERGOCALCIFEROL) 1.25 MG (50000 UNIT) PO CAPS: 50000.0000 [IU] | ORAL_CAPSULE | ORAL | 0 refills | Status: DC

## 2015-10-26 ENCOUNTER — Encounter: Payer: Self-pay | Admitting: Family Medicine

## 2015-10-26 ENCOUNTER — Telehealth: Payer: Self-pay | Admitting: Family Medicine

## 2015-10-26 NOTE — Telephone Encounter (Signed)
Pt states that she was unaware that a Rx for Vit D was called in for her and p/u OTC vit D. Pt has been taking 10,000 units daily and asking if this is ok or should she get the Rx and start taking it. Pt is aware that KT is out of the office today and it would be Monday before this was address.

## 2015-10-29 MED ORDER — ROSUVASTATIN CALCIUM 10 MG PO TABS: 10.0000 mg | ORAL_TABLET | Freq: Every day | ORAL | 1 refills | Status: DC

## 2015-10-29 MED ORDER — AMLODIPINE BESYLATE 5 MG PO TABS: 5.0000 mg | ORAL_TABLET | Freq: Every day | ORAL | 1 refills | Status: DC

## 2015-10-29 NOTE — Telephone Encounter (Signed)
Called patient and LMOVM to return call.     

## 2015-10-29 NOTE — Telephone Encounter (Signed)
She can continue with the 10,000 units daily and skip the prescription at this time

## 2015-10-29 NOTE — Telephone Encounter (Signed)
Pt informed on mychart

## 2015-11-07 ENCOUNTER — Other Ambulatory Visit: Payer: Self-pay | Admitting: Obstetrics and Gynecology

## 2015-11-07 DIAGNOSIS — Z1231 Encounter for screening mammogram for malignant neoplasm of breast: Secondary | ICD-10-CM

## 2015-11-20 ENCOUNTER — Ambulatory Visit
Admission: RE | Admit: 2015-11-20 | Discharge: 2015-11-20 | Disposition: A | Discharge status: Home / Self Care | Payer: Commercial Managed Care - PPO | Source: Ambulatory Visit | Attending: Obstetrics and Gynecology | Admitting: Obstetrics and Gynecology

## 2015-11-20 DIAGNOSIS — Z1231 Encounter for screening mammogram for malignant neoplasm of breast: Secondary | ICD-10-CM

## 2016-04-07 DIAGNOSIS — N200 Calculus of kidney: Secondary | ICD-10-CM | POA: Diagnosis not present

## 2016-04-18 ENCOUNTER — Encounter: Payer: Self-pay | Admitting: Family Medicine

## 2016-04-18 ENCOUNTER — Ambulatory Visit (INDEPENDENT_AMBULATORY_CARE_PROVIDER_SITE_OTHER): Payer: Commercial Managed Care - PPO | Admitting: Family Medicine

## 2016-04-18 VITALS — BP 126/76 | HR 82 | Temp 98.3°F | Resp 16 | Ht 65.0 in | Wt 161.2 lb

## 2016-04-18 DIAGNOSIS — I10 Essential (primary) hypertension: Secondary | ICD-10-CM | POA: Diagnosis not present

## 2016-04-18 DIAGNOSIS — E785 Hyperlipidemia, unspecified: Secondary | ICD-10-CM | POA: Diagnosis not present

## 2016-04-18 LAB — CBC WITH DIFFERENTIAL/PLATELET
BASOS PCT: 1.1 % (ref 0.0–3.0)
Basophils Absolute: 0 10*3/uL (ref 0.0–0.1)
EOS ABS: 0.1 10*3/uL (ref 0.0–0.7)
EOS PCT: 2.3 % (ref 0.0–5.0)
HEMATOCRIT: 40.2 % (ref 36.0–46.0)
HEMOGLOBIN: 13.2 g/dL (ref 12.0–15.0)
Lymphocytes Relative: 45.8 % (ref 12.0–46.0)
Lymphs Abs: 1.1 10*3/uL (ref 0.7–4.0)
MCHC: 32.8 g/dL (ref 30.0–36.0)
MCV: 99.1 fl (ref 78.0–100.0)
MONO ABS: 0.2 10*3/uL (ref 0.1–1.0)
Monocytes Relative: 9.2 % (ref 3.0–12.0)
NEUTROS ABS: 1 10*3/uL — AB (ref 1.4–7.7)
Neutrophils Relative %: 41.6 % — ABNORMAL LOW (ref 43.0–77.0)
PLATELETS: 189 10*3/uL (ref 150.0–400.0)
RBC: 4.05 Mil/uL (ref 3.87–5.11)
RDW: 13.2 % (ref 11.5–15.5)
WBC: 2.4 10*3/uL — ABNORMAL LOW (ref 4.0–10.5)

## 2016-04-18 LAB — LIPID PANEL
CHOLESTEROL: 157 mg/dL (ref 0–200)
HDL: 52.6 mg/dL (ref >39.00)
LDL Cholesterol: 93 mg/dL (ref 0–99)
NONHDL: 103.91
Total CHOL/HDL Ratio: 3
Triglycerides: 55 mg/dL (ref 0.0–149.0)
VLDL: 11 mg/dL (ref 0.0–40.0)

## 2016-04-18 LAB — BASIC METABOLIC PANEL
BUN: 19 mg/dL (ref 6–23)
CALCIUM: 9.4 mg/dL (ref 8.4–10.5)
CO2: 31 meq/L (ref 19–32)
Chloride: 105 mEq/L (ref 96–112)
Creatinine, Ser: 0.77 mg/dL (ref 0.40–1.20)
GFR: 98.65 mL/min (ref >60.00)
Glucose, Bld: 98 mg/dL (ref 70–99)
POTASSIUM: 3.9 meq/L (ref 3.5–5.1)
SODIUM: 141 meq/L (ref 135–145)

## 2016-04-18 LAB — HEPATIC FUNCTION PANEL
ALK PHOS: 56 U/L (ref 39–117)
ALT: 13 U/L (ref 0–35)
AST: 17 U/L (ref 0–37)
Albumin: 4.2 g/dL (ref 3.5–5.2)
BILIRUBIN DIRECT: 0.1 mg/dL (ref 0.0–0.3)
TOTAL PROTEIN: 7.1 g/dL (ref 6.0–8.3)
Total Bilirubin: 0.6 mg/dL (ref 0.2–1.2)

## 2016-04-18 LAB — TSH: TSH: 0.85 u[IU]/mL (ref 0.35–4.50)

## 2016-04-18 NOTE — Progress Notes (Signed)
Pre visit review using our clinic review tool, if applicable. No additional management support is needed unless otherwise documented below in the visit note. 

## 2016-04-18 NOTE — Patient Instructions (Signed)
Schedule your complete physical in 6 months We'll notify you of your lab results and make any changes if needed Keep up the good work on healthy diet and regular exercise- you look great!!! Call with any questions or concerns Enjoy your trip!!!!

## 2016-04-18 NOTE — Progress Notes (Signed)
   Subjective:    Patient ID: Brandy May, female    DOB: 09/11/1955, 61 y.o.   MRN: [id number]  HPI HTN- chronic problem, on Amlodipine 5mg  daily.  Well controlled today.  No CP, SOB, HAs, visual changes, edema.  Hyperlipidemia- chronic problem, on Crestor 10mg  daily.  No abd pain, N/V, myalgias.   Review of Systems For ROS see HPI     Objective:   Physical Exam  Constitutional: She is oriented to person, place, and time. She appears well-developed and well-nourished. No distress.  HENT:  Head: Normocephalic and atraumatic.  Eyes: Conjunctivae and EOM are normal. Pupils are equal, round, and reactive to light.  Neck: Normal range of motion. Neck supple. No thyromegaly present.  Cardiovascular: Normal rate, regular rhythm, normal heart sounds and intact distal pulses.   No murmur heard. Pulmonary/Chest: Effort normal and breath sounds normal. No respiratory distress.  Abdominal: Soft. She exhibits no distension. There is no tenderness.  Musculoskeletal: She exhibits no edema.  Lymphadenopathy:    She has no cervical adenopathy.  Neurological: She is alert and oriented to person, place, and time.  Skin: Skin is warm and dry.  Psychiatric: She has a normal mood and affect. Her behavior is normal.  Vitals reviewed.         Assessment & Plan:

## 2016-04-18 NOTE — Assessment & Plan Note (Signed)
Chronic problem.  Tolerating statin w/o difficulty.  Applauded healthy diet and regular exercise.  Check labs.  Adjust meds prn  

## 2016-04-18 NOTE — Assessment & Plan Note (Signed)
Chronic problem, well controlled.  Asymptomatic.  Check labs.  No anticipated med changes. °

## 2016-04-30 ENCOUNTER — Other Ambulatory Visit: Payer: Self-pay | Admitting: Family Medicine

## 2016-05-01 DIAGNOSIS — N200 Calculus of kidney: Secondary | ICD-10-CM | POA: Diagnosis not present

## 2016-05-02 ENCOUNTER — Other Ambulatory Visit: Payer: Self-pay | Admitting: Urology

## 2016-05-28 ENCOUNTER — Encounter (HOSPITAL_BASED_OUTPATIENT_CLINIC_OR_DEPARTMENT_OTHER): Payer: Self-pay | Admitting: *Deleted

## 2016-05-28 NOTE — Progress Notes (Signed)
To [facility] at 1030- Istat,Ekg on arrival. Npo after Mn solids,clear liquids(no dairy,pulp)until 0600,then Npo-will take amlodipine in am.

## 2016-06-09 ENCOUNTER — Ambulatory Visit (HOSPITAL_BASED_OUTPATIENT_CLINIC_OR_DEPARTMENT_OTHER): Payer: Commercial Managed Care - PPO | Admitting: Anesthesiology

## 2016-06-09 ENCOUNTER — Encounter (HOSPITAL_BASED_OUTPATIENT_CLINIC_OR_DEPARTMENT_OTHER)
Admission: RE | Disposition: A | Discharge status: Home / Self Care | Payer: Self-pay | Source: Ambulatory Visit | Attending: Urology

## 2016-06-09 ENCOUNTER — Other Ambulatory Visit: Payer: Self-pay

## 2016-06-09 ENCOUNTER — Ambulatory Visit (HOSPITAL_BASED_OUTPATIENT_CLINIC_OR_DEPARTMENT_OTHER)
Admission: RE | Admit: 2016-06-09 | Discharge: 2016-06-09 | Disposition: A | Discharge status: Home / Self Care | Payer: Commercial Managed Care - PPO | Source: Ambulatory Visit | Attending: Urology | Admitting: Urology

## 2016-06-09 ENCOUNTER — Encounter (HOSPITAL_BASED_OUTPATIENT_CLINIC_OR_DEPARTMENT_OTHER): Payer: Self-pay | Admitting: Anesthesiology

## 2016-06-09 DIAGNOSIS — Z466 Encounter for fitting and adjustment of urinary device: Secondary | ICD-10-CM | POA: Diagnosis not present

## 2016-06-09 DIAGNOSIS — Z79899 Other long term (current) drug therapy: Secondary | ICD-10-CM | POA: Insufficient documentation

## 2016-06-09 DIAGNOSIS — Z809 Family history of malignant neoplasm, unspecified: Secondary | ICD-10-CM | POA: Insufficient documentation

## 2016-06-09 DIAGNOSIS — Z79890 Hormone replacement therapy (postmenopausal): Secondary | ICD-10-CM | POA: Insufficient documentation

## 2016-06-09 DIAGNOSIS — I10 Essential (primary) hypertension: Secondary | ICD-10-CM | POA: Insufficient documentation

## 2016-06-09 DIAGNOSIS — Z91018 Allergy to other foods: Secondary | ICD-10-CM | POA: Diagnosis not present

## 2016-06-09 DIAGNOSIS — Z8261 Family history of arthritis: Secondary | ICD-10-CM | POA: Insufficient documentation

## 2016-06-09 DIAGNOSIS — Z8249 Family history of ischemic heart disease and other diseases of the circulatory system: Secondary | ICD-10-CM | POA: Diagnosis not present

## 2016-06-09 DIAGNOSIS — Z888 Allergy status to other drugs, medicaments and biological substances status: Secondary | ICD-10-CM | POA: Insufficient documentation

## 2016-06-09 DIAGNOSIS — E785 Hyperlipidemia, unspecified: Secondary | ICD-10-CM | POA: Insufficient documentation

## 2016-06-09 DIAGNOSIS — N200 Calculus of kidney: Secondary | ICD-10-CM | POA: Diagnosis present

## 2016-06-09 HISTORY — PX: CYSTOSCOPY/RETROGRADE/URETEROSCOPY: SHX5316

## 2016-06-09 SURGERY — CYSTOSCOPY/RETROGRADE/URETEROSCOPY
Anesthesia: General | Site: Bladder | Laterality: Right

## 2016-06-09 MED ORDER — SODIUM CHLORIDE 0.9 % IR SOLN
Status: DC | PRN
  Administered 2016-06-09: 3000 mL via INTRAVESICAL
  Administered 2016-06-09: 1000 mL via INTRAVESICAL

## 2016-06-09 MED ORDER — LIDOCAINE 2% (20 MG/ML) 5 ML SYRINGE
INTRAMUSCULAR | Status: DC | PRN
  Administered 2016-06-09: 100 mg via INTRAVENOUS

## 2016-06-09 MED ORDER — PROPOFOL 10 MG/ML IV BOLUS
INTRAVENOUS | Status: DC | PRN
  Administered 2016-06-09: 150 mg via INTRAVENOUS

## 2016-06-09 MED ORDER — CEFAZOLIN SODIUM-DEXTROSE 2-4 GM/100ML-% IV SOLN
2.0000 g | INTRAVENOUS | Status: AC
  Administered 2016-06-09: 2 g via INTRAVENOUS
  Filled 2016-06-09: qty 100

## 2016-06-09 MED ORDER — PROPOFOL 10 MG/ML IV BOLUS
INTRAVENOUS | Status: AC
  Filled 2016-06-09: qty 20

## 2016-06-09 MED ORDER — PHENAZOPYRIDINE HCL 100 MG PO TABS: 100.0000 mg | ORAL_TABLET | Freq: Three times a day (TID) | ORAL | 0 refills | Status: DC | PRN

## 2016-06-09 MED ORDER — TAMSULOSIN HCL 0.4 MG PO CAPS: 0.4000 mg | ORAL_CAPSULE | Freq: Every day | ORAL | 0 refills | Status: DC

## 2016-06-09 MED ORDER — KETOROLAC TROMETHAMINE 30 MG/ML IJ SOLN
INTRAMUSCULAR | Status: DC | PRN
  Administered 2016-06-09: 30 mg via INTRAVENOUS

## 2016-06-09 MED ORDER — FENTANYL CITRATE (PF) 100 MCG/2ML IJ SOLN
INTRAMUSCULAR | Status: AC
  Filled 2016-06-09: qty 2

## 2016-06-09 MED ORDER — OXYCODONE HCL 5 MG PO TABS
5.0000 mg | ORAL_TABLET | Freq: Once | ORAL | Status: DC | PRN
  Filled 2016-06-09: qty 1

## 2016-06-09 MED ORDER — MIDAZOLAM HCL 5 MG/5ML IJ SOLN
INTRAMUSCULAR | Status: DC | PRN
  Administered 2016-06-09: 2 mg via INTRAVENOUS

## 2016-06-09 MED ORDER — PHENYLEPHRINE 40 MCG/ML (10ML) SYRINGE FOR IV PUSH (FOR BLOOD PRESSURE SUPPORT)
PREFILLED_SYRINGE | INTRAVENOUS | Status: DC | PRN
  Administered 2016-06-09 (×2): 120 ug via INTRAVENOUS
  Administered 2016-06-09: 160 ug via INTRAVENOUS

## 2016-06-09 MED ORDER — ONDANSETRON HCL 4 MG/2ML IJ SOLN
INTRAMUSCULAR | Status: AC
  Filled 2016-06-09: qty 2

## 2016-06-09 MED ORDER — LACTATED RINGERS IV SOLN
INTRAVENOUS | Status: DC
  Administered 2016-06-09 (×2): via INTRAVENOUS
  Filled 2016-06-09: qty 1000

## 2016-06-09 MED ORDER — ONDANSETRON HCL 4 MG/2ML IJ SOLN
INTRAMUSCULAR | Status: DC | PRN
  Administered 2016-06-09: 4 mg via INTRAVENOUS

## 2016-06-09 MED ORDER — EPHEDRINE 5 MG/ML INJ
INTRAVENOUS | Status: AC
  Filled 2016-06-09: qty 10

## 2016-06-09 MED ORDER — DEXAMETHASONE SODIUM PHOSPHATE 4 MG/ML IJ SOLN
INTRAMUSCULAR | Status: DC | PRN
  Administered 2016-06-09: 10 mg via INTRAVENOUS

## 2016-06-09 MED ORDER — OXYBUTYNIN CHLORIDE ER 10 MG PO TB24: 10.0000 mg | ORAL_TABLET | Freq: Every day | ORAL | 2 refills | Status: DC

## 2016-06-09 MED ORDER — EPHEDRINE SULFATE-NACL 50-0.9 MG/10ML-% IV SOSY
PREFILLED_SYRINGE | INTRAVENOUS | Status: DC | PRN
  Administered 2016-06-09: 10 mg via INTRAVENOUS

## 2016-06-09 MED ORDER — CEFAZOLIN SODIUM-DEXTROSE 2-4 GM/100ML-% IV SOLN
INTRAVENOUS | Status: AC
  Filled 2016-06-09: qty 100

## 2016-06-09 MED ORDER — FENTANYL CITRATE (PF) 100 MCG/2ML IJ SOLN
INTRAMUSCULAR | Status: DC | PRN
  Administered 2016-06-09: 50 ug via INTRAVENOUS

## 2016-06-09 MED ORDER — LIDOCAINE 2% (20 MG/ML) 5 ML SYRINGE
INTRAMUSCULAR | Status: AC
  Filled 2016-06-09: qty 5

## 2016-06-09 MED ORDER — IOHEXOL 300 MG/ML  SOLN
INTRAMUSCULAR | Status: DC | PRN
  Administered 2016-06-09: 7 mL via INTRAVENOUS

## 2016-06-09 MED ORDER — KETOROLAC TROMETHAMINE 30 MG/ML IJ SOLN
INTRAMUSCULAR | Status: AC
  Filled 2016-06-09: qty 1

## 2016-06-09 MED ORDER — FENTANYL CITRATE (PF) 100 MCG/2ML IJ SOLN
25.0000 ug | INTRAMUSCULAR | Status: DC | PRN
  Filled 2016-06-09: qty 1

## 2016-06-09 MED ORDER — DEXAMETHASONE SODIUM PHOSPHATE 10 MG/ML IJ SOLN
INTRAMUSCULAR | Status: AC
Start: 2016-06-09 — End: 2016-06-09
  Filled 2016-06-09: qty 1

## 2016-06-09 MED ORDER — OXYCODONE-ACETAMINOPHEN 5-325 MG PO TABS: 1.0000 | ORAL_TABLET | ORAL | 0 refills | Status: DC | PRN

## 2016-06-09 MED ORDER — OXYCODONE HCL 5 MG/5ML PO SOLN
5.0000 mg | Freq: Once | ORAL | Status: DC | PRN
  Filled 2016-06-09: qty 5

## 2016-06-09 MED ORDER — MIDAZOLAM HCL 2 MG/2ML IJ SOLN
INTRAMUSCULAR | Status: AC
  Filled 2016-06-09: qty 2

## 2016-06-09 SURGICAL SUPPLY — 27 items
BAG DRAIN URO-CYSTO SKYTR STRL (DRAIN) ×2 IMPLANT
BAG DRN UROCATH (DRAIN) ×1
BASKET DAKOTA 1.9FR 11X120 (BASKET) IMPLANT
BASKET LASER NITINOL 1.9FR (BASKET) IMPLANT
BASKET STONE 1.7 NGAGE (UROLOGICAL SUPPLIES) ×1 IMPLANT
BASKET ZERO TIP NITINOL 2.4FR (BASKET) IMPLANT
BSKT STON RTRVL 120 1.9FR (BASKET)
BSKT STON RTRVL ZERO TP 2.4FR (BASKET)
CATH INTERMIT  6FR 70CM (CATHETERS) IMPLANT
CLOTH BEACON ORANGE TIMEOUT ST (SAFETY) ×2 IMPLANT
FIBER LASER FLEXIVA 365 (UROLOGICAL SUPPLIES) IMPLANT
FIBER LASER TRAC TIP (UROLOGICAL SUPPLIES) ×1 IMPLANT
GLOVE BIO SURGEON STRL SZ8 (GLOVE) ×2 IMPLANT
GOWN STRL REUS W/ TWL LRG LVL3 (GOWN DISPOSABLE) ×1 IMPLANT
GOWN STRL REUS W/ TWL XL LVL3 (GOWN DISPOSABLE) ×1 IMPLANT
GOWN STRL REUS W/TWL LRG LVL3 (GOWN DISPOSABLE) ×2
GOWN STRL REUS W/TWL XL LVL3 (GOWN DISPOSABLE) ×2
GUIDEWIRE ANG ZIPWIRE 038X150 (WIRE) ×2 IMPLANT
GUIDEWIRE STR DUAL SENSOR (WIRE) IMPLANT
IV NS IRRIG 3000ML ARTHROMATIC (IV SOLUTION) ×2 IMPLANT
KIT RM TURNOVER CYSTO AR (KITS) ×2 IMPLANT
MANIFOLD NEPTUNE II (INSTRUMENTS) IMPLANT
PACK CYSTO (CUSTOM PROCEDURE TRAY) ×2 IMPLANT
STENT URET 6FRX26 CONTOUR (STENTS) ×1 IMPLANT
SYRINGE 10CC LL (SYRINGE) ×2 IMPLANT
TUBE CONNECTING 12X1/4 (SUCTIONS) IMPLANT
TUBE FEEDING 8FR 16IN STR KANG (MISCELLANEOUS) IMPLANT

## 2016-06-09 NOTE — H&P (Signed)
Urology Admission H&P  Chief Complaint: right renal calculus  History of Present Illness: Brandy May is a 61yo with a right renal calculus which has been growing in size  Past Medical History:  Diagnosis Date  . History of DVT of lower extremity 1991   w/ second pregancy  . History of kidney stones 2016  . History of peptic ulcer 1998  . Hyperlipidemia   . Hypertension   . Wears glasses    Past Surgical History:  Procedure Laterality Date  . [place]  . CYSTOSCOPY W/ URETERAL STENT PLACEMENT Right 08/30/2014   Procedure: CYSTOSCOPY WITH STENT REPLACEMENT;  Surgeon: Cleon Gustin, MD;  Location: [facility];  Service: Urology;  Laterality: Right;  . CYSTOSCOPY WITH URETEROSCOPY AND STENT PLACEMENT Right 08/16/2014   Procedure: CYSTOSCOPY WITH URETEROSCOPY AND STENT PLACEMENT;  Surgeon: Cleon Gustin, MD;  Location: [facility];  Service: Urology;  Laterality: Right;  . CYSTOSCOPY/RETROGRADE/URETEROSCOPY/STONE EXTRACTION WITH BASKET Right 08/30/2014   Procedure: CYSTOSCOPY/RETROGRADE/URETEROSCOPY/STONE EXTRACTION  ;  Surgeon: Cleon Gustin, MD;  Location: [facility];  Service: Urology;  Laterality: Right;  . KNEE ARTHROSCOPY Right 07-19-2010  . RIGHT THUMB TRIGGER RELEASE  2014  . TUBAL LIGATION  1994    Home Medications:  Prescriptions Prior to Admission  Medication Sig Dispense Refill Last Dose  . amLODipine (NORVASC) 5 MG tablet TAKE 1 TABLET DAILY 90 tablet 1 06/09/2016 at 0900  . rosuvastatin (CRESTOR) 10 MG tablet TAKE 1 TABLET DAILY 90 tablet 1 06/09/2016 at 0900  . VAGIFEM 10 MCG TABS vaginal tablet Place vaginally 2 (two) times a week.    06/05/2016 at Unknown time  . oxyCODONE-acetaminophen (PERCOCET/ROXICET) 5-325 MG tablet Take 1 tablet by mouth every 4 (four) hours as needed for severe pain.   More than a month at Unknown time   Allergies:  Allergies  Allergen Reactions  . Kiwi Extract Swelling     Throat  . Coumadin [Warfarin Sodium] Itching    Severe Itching     Family History  Problem Relation Age of Onset  . Arthritis Mother   . Hyperlipidemia Mother   . Hypertension Mother   . Cancer Father   . Hyperlipidemia Sister    Social History:  reports that she has never smoked. She has never used smokeless tobacco. She reports that she does not drink alcohol or use drugs.  Review of Systems  All other systems reviewed and are negative.   Physical Exam:  Vital signs in last 24 hours: Temp:  [98 F (36.7 C)] 98 F (36.7 C) (05/14 1013) Pulse Rate:  [72] 72 (05/14 1013) Resp:  [19] 19 (05/14 1013) BP: (148)/(89) 148/89 (05/14 1013) SpO2:  [100 %] 100 % (05/14 1013) Weight:  [73.5 kg (162 lb)] 73.5 kg (162 lb) (05/14 1013) Physical Exam  Constitutional: She is oriented to person, place, and time. She appears well-developed and well-nourished.  HENT:  Head: Normocephalic and atraumatic.  Eyes: EOM are normal. Pupils are equal, round, and reactive to light.  Neck: Normal range of motion. No thyromegaly present.  Cardiovascular: Normal rate and regular rhythm.   Respiratory: Effort normal. No respiratory distress.  GI: Soft. She exhibits no distension.  Musculoskeletal: Normal range of motion. She exhibits no edema.  Neurological: She is alert and oriented to person, place, and time.  Skin: Skin is warm and dry.  Psychiatric: She has a normal mood and affect. Her behavior is normal. Judgment  and thought content normal.    Laboratory Data:  No results found for this or any previous visit (from the past 24 hour(s)). No results found for this or any previous visit (from the past 240 hour(s)). Creatinine: No results for input(s): CREATININE in the last 168 hours. Baseline Creatinine: unknwon  Impression/Assessment:  61yo with right renal calculus  Plan:  The risks/benefits/alternatives to R URS was explained to the patient and she understands and wishes to proceed  with surgery  Nicolette Bang 06/09/2016, 12:08 PM

## 2016-06-09 NOTE — Transfer of Care (Addendum)
Immediate Anesthesia Transfer of Care Note  Patient: Brandy May  Procedure(s) Performed: Procedure(s) (LRB): CYSTOSCOPY/RETROGRADE/URETEROSCOPY WITH LASER/STENT PLACEMENT AND STONE BASKETRY (Right)  Patient Location: PACU  Anesthesia Type: General  Level of Consciousness: awake, alert  and oriented  Airway & Oxygen Therapy: Patient Spontanous Breathing and Patient connected to face mask oxygen  Post-op Assessment: Report given to PACU RN and Post -op Vital signs reviewed and stable  Post vital signs: Reviewed and stable  Complications: No apparent anesthesia complications Last Vitals:  Vitals:   06/09/16 1013  BP: (!) 148/89  Pulse: 72  Resp: 19  Temp: 36.7 C    Last Pain:  Vitals:   06/09/16 1044  TempSrc:   PainSc: 1       Patients Stated Pain Goal: 5 (06/09/16 1044)

## 2016-06-09 NOTE — Anesthesia Postprocedure Evaluation (Addendum)
Anesthesia Post Note  Patient: Brandy May  Procedure(s) Performed: Procedure(s) (LRB): CYSTOSCOPY/RETROGRADE/URETEROSCOPY WITH LASER/STENT PLACEMENT AND STONE BASKETRY (Right)  Patient location during evaluation: PACU Anesthesia Type: General Level of consciousness: awake and alert Pain management: pain level controlled Vital Signs Assessment: post-procedure vital signs reviewed and stable Respiratory status: spontaneous breathing, nonlabored ventilation, respiratory function stable and patient connected to nasal cannula oxygen Cardiovascular status: blood pressure returned to baseline and stable Postop Assessment: no signs of nausea or vomiting Anesthetic complications: no       Last Vitals:  Vitals:   06/09/16 1415 06/09/16 1451  BP: 131/74 132/70  Pulse: 73 74  Resp: (!) 32 20  Temp:  36.7 C    Last Pain:  Vitals:   06/09/16 1451  TempSrc: Oral  PainSc: 0-No pain                 Jaycelyn Orrison

## 2016-06-09 NOTE — Anesthesia Procedure Notes (Signed)
Procedure Name: LMA Insertion Date/Time: 06/09/2016 12:30 PM Performed by: Oleta Mouse Pre-anesthesia Checklist: Patient identified, Emergency Drugs available, Suction available and Patient being monitored Patient Re-evaluated:Patient Re-evaluated prior to inductionOxygen Delivery Method: Circle system utilized Preoxygenation: Pre-oxygenation with 100% oxygen Intubation Type: IV induction Ventilation: Mask ventilation without difficulty LMA: LMA inserted LMA Size: 4.0 Number of attempts: 1 Airway Equipment and Method: Bite block Placement Confirmation: positive ETCO2 Tube secured with: Tape Dental Injury: Teeth and Oropharynx as per pre-operative assessment

## 2016-06-09 NOTE — Op Note (Signed)
Preoperative diagnosis: Right renal calculi  Postoperative diagnosis: Same  Procedure: 1 cystoscopy 2 right retrograde pyelography 3.  Intraoperative fluoroscopy, under one hour, with interpretation 4.  Right ureteroscopic stone manipulation with laser lithotripsy 5.  Right 6 x 26 JJ stent placement  Attending: Rosie Fate  Anesthesia: General  Estimated blood loss: None  Drains: Right 6 x 26 JJ ureteral stent without tether  Specimens: stone  Antibiotics: Ancef  Findings: 2 5-110mm submucosal calculi in he upper and mid poles.  Indications: Patient is a 61 year old female with a history of right renal calculi and who has persistent right flank pain.  After discussing treatment options, she decided proceed with right ureteroscopic stone manipulation.  Procedure her in detail: The patient was brought to the operating room and a brief timeout was done to ensure correct patient, correct procedure, correct site.  General anesthesia was administered patient was placed in dorsal lithotomy position.  Her genitalia was then prepped and draped in usual sterile fashion.  A rigid 62 French cystoscope was passed in the urethra and the bladder.  Bladder was inspected free masses or lesions.  the right ureteral orifices were in the normal orthotopic locations.  a 6 french ureteral catheter was then instilled into the right ureter orifice.  a gentle retrograde was obtained and findings noted above.  we then placed a zip wire through the ureteral catheter and advanced up to the renal pelvis.  we then removed the cystoscope and cannulated the right ureteral orifice with a semirigid ureteroscope.  we then encountered no stone in the ureter. We then advanced a sensor wire into the renal pelvis. Over the sensor wire a 12/14 x 38cm access sheath was advanced up to the renal pelvis. We then used the flexible ureteroscope to perform nephroscopy. We located a submucosal stone in the upper and mid poles  uusing  a 200 nm laser fiber the stones were fragmented into smaller pieces.  the pieces were then removed with a engage basket. Once the fragments were removed we removed the access sheath under diret vision and noted no injury to the ureter. we then placed a 6 x 26 double-j ureteral stent over the original zip wire. We then removed the wire and good coil was noted in the the renal pelvis under fluoroscopy and the bladder under direct vision.       the bladder was then drained and this concluded the procedure which was well tolerated by patient.  Complications: None  Condition: Stable, extubated, transferred to PACU  Plan: Patient is to be discharged home as to follow-up in 2 weeks for stent removal.

## 2016-06-09 NOTE — Anesthesia Preprocedure Evaluation (Signed)
Anesthesia Evaluation  Patient identified by MRN, date of birth, ID band Patient awake    Reviewed: Allergy & Precautions, NPO status , Patient's Chart, lab work & pertinent test results  Airway Mallampati: II  TM Distance: >3 FB Neck ROM: Full    Dental  (+) Teeth Intact, Dental Advisory Given   Pulmonary neg pulmonary ROS,    breath sounds clear to auscultation       Cardiovascular Exercise Tolerance: Good hypertension, Pt. on medications Normal cardiovascular exam Rhythm:Regular Rate:Normal     Neuro/Psych negative neurological ROS  negative psych ROS   GI/Hepatic negative GI ROS, Neg liver ROS,   Endo/Other  negative endocrine ROS  Renal/GU Renal disease     Musculoskeletal negative musculoskeletal ROS (+)   Abdominal   Peds  Hematology negative hematology ROS (+)   Anesthesia Other Findings   Reproductive/Obstetrics                             Anesthesia Physical Anesthesia Plan  ASA: II  Anesthesia Plan: General   Post-op Pain Management:    Induction: Intravenous  Airway Management Planned: LMA  Additional Equipment: None  Intra-op Plan:   Post-operative Plan: Extubation in OR  Informed Consent: I have reviewed the patients History and Physical, chart, labs and discussed the procedure including the risks, benefits and alternatives for the proposed anesthesia with the patient or authorized representative who has indicated his/her understanding and acceptance.   Dental advisory given  Plan Discussed with: CRNA and Surgeon  Anesthesia Plan Comments:         Anesthesia Quick Evaluation

## 2016-06-09 NOTE — Discharge Instructions (Signed)
Post Anesthesia Home Care Instructions  Activity: Get plenty of rest for the remainder of the day. A responsible individual must stay with you for 24 hours following the procedure.  For the next 24 hours, DO NOT: -Drive a car -Paediatric nurse -Drink alcoholic beverages -Take any medication unless instructed by your physician -Make any legal decisions or sign important papers.  Meals: Start with liquid foods such as gelatin or soup. Progress to regular foods as tolerated. Avoid greasy, spicy, heavy foods. If nausea and/or vomiting occur, drink only clear liquids until the nausea and/or vomiting subsides. Call your physician if vomiting continues.  Special Instructions/Symptoms: Your throat may feel dry or sore from the anesthesia or the breathing tube placed in your throat during surgery. If this causes discomfort, gargle with warm salt water. The discomfort should disappear within 24 hours.  If you had a scopolamine patch placed behind your ear for the management of post- operative nausea and/or vomiting:  1. The medication in the patch is effective for 72 hours, after which it should be removed.  Wrap patch in a tissue and discard in the trash. Wash hands thoroughly with soap and water. 2. You may remove the patch earlier than 72 hours if you experience unpleasant side effects which may include dry mouth, dizziness or visual disturbances. 3. Avoid touching the patch. Wash your hands with soap and water after contact with the patch.   Ureteral Stent Implantation, Care After Refer to this sheet in the next few weeks. These instructions provide you with information about caring for yourself after your procedure. Your health care provider may also give you more specific instructions. Your treatment has been planned according to current medical practices, but problems sometimes occur. Call your health care provider if you have any problems or questions after your procedure. What can I expect  after the procedure? After the procedure, it is common to have:  Nausea.  Mild pain when you urinate. You may feel this pain in your lower back or lower abdomen. Pain should stop within a few minutes after you urinate. This may last for up to 1 week.  A small amount of blood in your urine for several days. Follow these instructions at home:   Medicines   Take over-the-counter and prescription medicines only as told by your health care provider.  If you were prescribed an antibiotic medicine, take it as told by your health care provider. Do not stop taking the antibiotic even if you start to feel better.  Do not drive for 24 hours if you received a sedative.  Do not drive or operate heavy machinery while taking prescription pain medicines. Activity   Return to your normal activities as told by your health care provider. Ask your health care provider what activities are safe for you.  Do not lift anything that is heavier than 10 lb (4.5 kg). Follow this limit for 1 week after your procedure, or for as long as told by your health care provider. General instructions   Watch for any blood in your urine. Call your health care provider if the amount of blood in your urine increases.  If you have a catheter:  Follow instructions from your health care provider about taking care of your catheter and collection bag.  Do not take baths, swim, or use a hot tub until your health care provider approves.  Drink enough fluid to keep your urine clear or pale yellow.  Keep all follow-up visits as told by your  health care provider. This is important. Contact a health care provider if:  You have pain that gets worse or does not get better with medicine, especially pain when you urinate.  You have difficulty urinating.  You feel nauseous or you vomit repeatedly during a period of more than 2 days after the procedure. Get help right away if:  Your urine is dark red or has blood clots in  it.  You are leaking urine (have incontinence).  The end of the stent comes out of your urethra.  You cannot urinate.  You have sudden, sharp, or severe pain in your abdomen or lower back.  You have a fever. This information is not intended to replace advice given to you by your health care provider. Make sure you discuss any questions you have with your health care provider. Document Released: 09/15/2012 Document Revised: 06/21/2015 Document Reviewed: 07/28/2014 Elsevier Interactive Patient Education  2017 [employer].

## 2016-06-10 ENCOUNTER — Encounter (HOSPITAL_BASED_OUTPATIENT_CLINIC_OR_DEPARTMENT_OTHER): Payer: Self-pay | Admitting: Urology

## 2016-06-10 LAB — POCT I-STAT, CHEM 8
BUN: 17 mg/dL (ref 6–20)
CREATININE: 0.7 mg/dL (ref 0.44–1.00)
Calcium, Ion: 1.3 mmol/L (ref 1.15–1.40)
Chloride: 106 mmol/L (ref 101–111)
GLUCOSE: 97 mg/dL (ref 65–99)
HCT: 41 % (ref 36.0–46.0)
HEMOGLOBIN: 13.9 g/dL (ref 12.0–15.0)
Potassium: 3.8 mmol/L (ref 3.5–5.1)
Sodium: 143 mmol/L (ref 135–145)
TCO2: 27 mmol/L (ref 0–100)

## 2016-06-24 DIAGNOSIS — N200 Calculus of kidney: Secondary | ICD-10-CM | POA: Diagnosis not present

## 2016-06-30 NOTE — Addendum Note (Signed)
Addendum  created 06/30/16 1511 by Oleta Mouse, MD   Sign clinical note

## 2016-07-31 DIAGNOSIS — N200 Calculus of kidney: Secondary | ICD-10-CM | POA: Diagnosis not present

## 2016-10-24 ENCOUNTER — Encounter: Payer: Self-pay | Admitting: Family Medicine

## 2016-10-24 ENCOUNTER — Ambulatory Visit (INDEPENDENT_AMBULATORY_CARE_PROVIDER_SITE_OTHER): Payer: Commercial Managed Care - PPO | Admitting: Family Medicine

## 2016-10-24 VITALS — BP 128/84 | HR 81 | Temp 98.0°F | Resp 16 | Ht 65.0 in | Wt 168.0 lb

## 2016-10-24 DIAGNOSIS — E785 Hyperlipidemia, unspecified: Secondary | ICD-10-CM | POA: Diagnosis not present

## 2016-10-24 DIAGNOSIS — Z23 Encounter for immunization: Secondary | ICD-10-CM | POA: Diagnosis not present

## 2016-10-24 DIAGNOSIS — E559 Vitamin D deficiency, unspecified: Secondary | ICD-10-CM | POA: Diagnosis not present

## 2016-10-24 DIAGNOSIS — Z0000 Encounter for general adult medical examination without abnormal findings: Secondary | ICD-10-CM

## 2016-10-24 LAB — BASIC METABOLIC PANEL
BUN: 16 mg/dL (ref 6–23)
CALCIUM: 9.7 mg/dL (ref 8.4–10.5)
CHLORIDE: 103 meq/L (ref 96–112)
CO2: 30 meq/L (ref 19–32)
CREATININE: 0.78 mg/dL (ref 0.40–1.20)
GFR: 97.02 mL/min (ref >60.00)
GLUCOSE: 96 mg/dL (ref 70–99)
Potassium: 3.6 mEq/L (ref 3.5–5.1)
Sodium: 140 mEq/L (ref 135–145)

## 2016-10-24 LAB — CBC WITH DIFFERENTIAL/PLATELET
BASOS ABS: 0 10*3/uL (ref 0.0–0.1)
Basophils Relative: 1.1 % (ref 0.0–3.0)
EOS ABS: 0 10*3/uL (ref 0.0–0.7)
Eosinophils Relative: 1.8 % (ref 0.0–5.0)
HEMATOCRIT: 42.6 % (ref 36.0–46.0)
HEMOGLOBIN: 13.8 g/dL (ref 12.0–15.0)
Lymphocytes Relative: 49 % — ABNORMAL HIGH (ref 12.0–46.0)
Lymphs Abs: 1.3 10*3/uL (ref 0.7–4.0)
MCHC: 32.4 g/dL (ref 30.0–36.0)
MCV: 100.2 fl — ABNORMAL HIGH (ref 78.0–100.0)
MONO ABS: 0.2 10*3/uL (ref 0.1–1.0)
Monocytes Relative: 8.3 % (ref 3.0–12.0)
Neutro Abs: 1 10*3/uL — ABNORMAL LOW (ref 1.4–7.7)
Neutrophils Relative %: 39.8 % — ABNORMAL LOW (ref 43.0–77.0)
Platelets: 194 10*3/uL (ref 150.0–400.0)
RBC: 4.25 Mil/uL (ref 3.87–5.11)
RDW: 13.4 % (ref 11.5–15.5)

## 2016-10-24 LAB — HEPATIC FUNCTION PANEL
ALBUMIN: 4.3 g/dL (ref 3.5–5.2)
ALT: 13 U/L (ref 0–35)
AST: 16 U/L (ref 0–37)
Alkaline Phosphatase: 61 U/L (ref 39–117)
BILIRUBIN TOTAL: 0.8 mg/dL (ref 0.2–1.2)
Bilirubin, Direct: 0.1 mg/dL (ref 0.0–0.3)
TOTAL PROTEIN: 7.4 g/dL (ref 6.0–8.3)

## 2016-10-24 LAB — LIPID PANEL
CHOLESTEROL: 187 mg/dL (ref 0–200)
HDL: 55.4 mg/dL (ref >39.00)
LDL Cholesterol: 118 mg/dL — ABNORMAL HIGH (ref 0–99)
NonHDL: 131.89
TRIGLYCERIDES: 68 mg/dL (ref 0.0–149.0)
Total CHOL/HDL Ratio: 3
VLDL: 13.6 mg/dL (ref 0.0–40.0)

## 2016-10-24 LAB — TSH: TSH: 1.34 u[IU]/mL (ref 0.35–4.50)

## 2016-10-24 LAB — VITAMIN D 25 HYDROXY (VIT D DEFICIENCY, FRACTURES): VITD: 30.83 ng/mL (ref 30.00–100.00)

## 2016-10-24 NOTE — Patient Instructions (Signed)
Follow up in 6 months to recheck BP and cholesterol We'll notify you of your lab results and make any changes Continue to work on healthy diet and regular exercise- you can do it! Schedule your mammo for late October You are due for recall Colonoscopy this spring- get ready! Call with any questions or concerns Happy Fall!!!

## 2016-10-24 NOTE — Assessment & Plan Note (Signed)
Pt's PE WNL.  UTD on pap, mammo, colonoscopy (pt is aware that mammo and colonoscopy are coming due).  Flu shot given today.  Check labs.  Anticipatory guidance provided.

## 2016-10-24 NOTE — Assessment & Plan Note (Signed)
Pt has hx of this.  Check labs.  Replete prn. 

## 2016-10-24 NOTE — Progress Notes (Signed)
   Subjective:    Patient ID: Brandy May, female    DOB: [birth year], 61 y.o.   MRN: [id number]  HPI CPE- UTD on colonoscopy (due this spring), mammo (due next month), UTD on pap.  Pt's goal is to do a 1/2 marathon for her 62th bday.   Review of Systems Patient reports no vision/ hearing changes, adenopathy,fever, weight change,  persistant/recurrent hoarseness , swallowing issues, chest pain, palpitations, edema, persistant/recurrent cough, hemoptysis, dyspnea (rest/exertional/paroxysmal nocturnal), gastrointestinal bleeding (melena, rectal bleeding), abdominal pain, significant heartburn, bowel changes, GU symptoms (dysuria, hematuria, incontinence), Gyn symptoms (abnormal  bleeding, pain),  syncope, focal weakness, memory loss, numbness & tingling, skin/hair/nail changes, abnormal bruising or bleeding, anxiety, or depression.     Objective:   Physical Exam General Appearance:    Alert, cooperative, no distress, appears stated age  Head:    Normocephalic, without obvious abnormality, atraumatic  Eyes:    PERRL, conjunctiva/corneas clear, EOM's intact, fundi    benign, both eyes  Ears:    Normal TM's and external ear canals, both ears  Nose:   Nares normal, septum midline, mucosa normal, no drainage    or sinus tenderness  Throat:   Lips, mucosa, and tongue normal; teeth and gums normal  Neck:   Supple, symmetrical, trachea midline, no adenopathy;    Thyroid: no enlargement/tenderness/nodules  Back:     Symmetric, no curvature, ROM normal, no CVA tenderness  Lungs:     Clear to auscultation bilaterally, respirations unlabored  Chest Wall:    No tenderness or deformity   Heart:    Regular rate and rhythm, S1 and S2 normal, no murmur, rub   or gallop  Breast Exam:    Deferred to GYN  Abdomen:     Soft, non-tender, bowel sounds active all four quadrants,    no masses, no organomegaly  Genitalia:    Deferred to GYN  Rectal:    Extremities:   Extremities normal, atraumatic, no cyanosis  or edema  Pulses:   2+ and symmetric all extremities  Skin:   Skin color, texture, turgor normal, no rashes or lesions  Lymph nodes:   Cervical, supraclavicular, and axillary nodes normal  Neurologic:   CNII-XII intact, normal strength, sensation and reflexes    throughout          Assessment & Plan:

## 2016-10-24 NOTE — Assessment & Plan Note (Signed)
Chronic problem.  Tolerating statin w/o difficulty.  Check labs.  Adjust meds prn  

## 2016-10-24 NOTE — Progress Notes (Signed)
Pre visit review using our clinic review tool, if applicable. No additional management support is needed unless otherwise documented below in the visit note. 

## 2016-10-27 ENCOUNTER — Other Ambulatory Visit: Payer: Self-pay | Admitting: Family Medicine

## 2016-10-27 DIAGNOSIS — D729 Disorder of white blood cells, unspecified: Secondary | ICD-10-CM

## 2016-11-07 ENCOUNTER — Other Ambulatory Visit: Payer: Self-pay | Admitting: Obstetrics and Gynecology

## 2016-11-07 DIAGNOSIS — Z1231 Encounter for screening mammogram for malignant neoplasm of breast: Secondary | ICD-10-CM

## 2016-11-12 ENCOUNTER — Other Ambulatory Visit: Payer: Self-pay | Admitting: Family Medicine

## 2016-11-13 DIAGNOSIS — N200 Calculus of kidney: Secondary | ICD-10-CM | POA: Diagnosis not present

## 2016-11-13 DIAGNOSIS — R8271 Bacteriuria: Secondary | ICD-10-CM | POA: Diagnosis not present

## 2016-11-24 DIAGNOSIS — N200 Calculus of kidney: Secondary | ICD-10-CM | POA: Diagnosis not present

## 2016-11-25 ENCOUNTER — Ambulatory Visit
Admission: RE | Admit: 2016-11-25 | Discharge: 2016-11-25 | Disposition: A | Discharge status: Home / Self Care | Payer: Commercial Managed Care - PPO | Source: Ambulatory Visit | Attending: Obstetrics and Gynecology | Admitting: Obstetrics and Gynecology

## 2016-11-25 DIAGNOSIS — Z1231 Encounter for screening mammogram for malignant neoplasm of breast: Secondary | ICD-10-CM | POA: Diagnosis not present

## 2016-11-26 ENCOUNTER — Other Ambulatory Visit: Payer: Self-pay | Admitting: Obstetrics and Gynecology

## 2016-11-26 DIAGNOSIS — R928 Other abnormal and inconclusive findings on diagnostic imaging of breast: Secondary | ICD-10-CM

## 2016-11-28 ENCOUNTER — Ambulatory Visit: Payer: Commercial Managed Care - PPO

## 2016-11-28 ENCOUNTER — Ambulatory Visit
Admission: RE | Admit: 2016-11-28 | Discharge: 2016-11-28 | Disposition: A | Discharge status: Home / Self Care | Payer: Commercial Managed Care - PPO | Source: Ambulatory Visit | Attending: Obstetrics and Gynecology | Admitting: Obstetrics and Gynecology

## 2016-11-28 ENCOUNTER — Other Ambulatory Visit: Payer: Self-pay | Admitting: Obstetrics and Gynecology

## 2016-11-28 DIAGNOSIS — R928 Other abnormal and inconclusive findings on diagnostic imaging of breast: Secondary | ICD-10-CM

## 2016-12-01 ENCOUNTER — Ambulatory Visit
Admission: RE | Admit: 2016-12-01 | Discharge: 2016-12-01 | Disposition: A | Discharge status: Home / Self Care | Payer: Commercial Managed Care - PPO | Source: Ambulatory Visit | Attending: Obstetrics and Gynecology | Admitting: Obstetrics and Gynecology

## 2016-12-01 ENCOUNTER — Other Ambulatory Visit: Payer: Self-pay | Admitting: Obstetrics and Gynecology

## 2016-12-01 DIAGNOSIS — R928 Other abnormal and inconclusive findings on diagnostic imaging of breast: Secondary | ICD-10-CM

## 2016-12-01 DIAGNOSIS — N6489 Other specified disorders of breast: Secondary | ICD-10-CM

## 2016-12-08 DIAGNOSIS — Z01419 Encounter for gynecological examination (general) (routine) without abnormal findings: Secondary | ICD-10-CM | POA: Diagnosis not present

## 2017-01-08 DIAGNOSIS — N200 Calculus of kidney: Secondary | ICD-10-CM | POA: Diagnosis not present

## 2017-04-24 ENCOUNTER — Ambulatory Visit: Payer: Commercial Managed Care - PPO | Admitting: Family Medicine

## 2017-04-29 ENCOUNTER — Encounter: Payer: Self-pay | Admitting: Family Medicine

## 2017-04-29 ENCOUNTER — Encounter: Payer: Self-pay | Admitting: General Practice

## 2017-04-29 ENCOUNTER — Other Ambulatory Visit: Payer: Self-pay

## 2017-04-29 ENCOUNTER — Ambulatory Visit (INDEPENDENT_AMBULATORY_CARE_PROVIDER_SITE_OTHER): Payer: Commercial Managed Care - PPO | Admitting: Family Medicine

## 2017-04-29 VITALS — BP 128/82 | HR 73 | Temp 98.0°F | Resp 16 | Ht 66.0 in | Wt 168.4 lb

## 2017-04-29 DIAGNOSIS — E785 Hyperlipidemia, unspecified: Secondary | ICD-10-CM | POA: Diagnosis not present

## 2017-04-29 DIAGNOSIS — I10 Essential (primary) hypertension: Secondary | ICD-10-CM

## 2017-04-29 LAB — HEPATIC FUNCTION PANEL
ALT: 14 U/L (ref 0–35)
AST: 19 U/L (ref 0–37)
Albumin: 3.9 g/dL (ref 3.5–5.2)
Alkaline Phosphatase: 71 U/L (ref 39–117)
BILIRUBIN TOTAL: 0.6 mg/dL (ref 0.2–1.2)
Bilirubin, Direct: 0.1 mg/dL (ref 0.0–0.3)
Total Protein: 7.3 g/dL (ref 6.0–8.3)

## 2017-04-29 LAB — LIPID PANEL
CHOL/HDL RATIO: 3
Cholesterol: 148 mg/dL (ref 0–200)
HDL: 53.9 mg/dL (ref >39.00)
LDL Cholesterol: 84 mg/dL (ref 0–99)
NONHDL: 94.58
Triglycerides: 51 mg/dL (ref 0.0–149.0)
VLDL: 10.2 mg/dL (ref 0.0–40.0)

## 2017-04-29 LAB — BASIC METABOLIC PANEL
BUN: 18 mg/dL (ref 6–23)
CHLORIDE: 105 meq/L (ref 96–112)
CO2: 31 mEq/L (ref 19–32)
Calcium: 9.4 mg/dL (ref 8.4–10.5)
Creatinine, Ser: 0.78 mg/dL (ref 0.40–1.20)
GFR: 96.85 mL/min (ref >60.00)
Glucose, Bld: 93 mg/dL (ref 70–99)
POTASSIUM: 4.1 meq/L (ref 3.5–5.1)
Sodium: 141 mEq/L (ref 135–145)

## 2017-04-29 LAB — CBC WITH DIFFERENTIAL/PLATELET
Basophils Absolute: 0 10*3/uL (ref 0.0–0.1)
Basophils Relative: 0.8 % (ref 0.0–3.0)
EOS ABS: 0.1 10*3/uL (ref 0.0–0.7)
EOS PCT: 3.9 % (ref 0.0–5.0)
HEMATOCRIT: 41.6 % (ref 36.0–46.0)
Hemoglobin: 13.9 g/dL (ref 12.0–15.0)
LYMPHS PCT: 46.7 % — AB (ref 12.0–46.0)
Lymphs Abs: 1.2 10*3/uL (ref 0.7–4.0)
MCHC: 33.3 g/dL (ref 30.0–36.0)
MCV: 98.4 fl (ref 78.0–100.0)
MONO ABS: 0.2 10*3/uL (ref 0.1–1.0)
Monocytes Relative: 7.5 % (ref 3.0–12.0)
Neutro Abs: 1.1 10*3/uL — ABNORMAL LOW (ref 1.4–7.7)
Neutrophils Relative %: 41.1 % — ABNORMAL LOW (ref 43.0–77.0)
PLATELETS: 188 10*3/uL (ref 150.0–400.0)
RBC: 4.23 Mil/uL (ref 3.87–5.11)
RDW: 13.6 % (ref 11.5–15.5)
WBC: 2.6 10*3/uL — ABNORMAL LOW (ref 4.0–10.5)

## 2017-04-29 NOTE — Patient Instructions (Signed)
Schedule your complete physical in 6 months Keep up the good work on healthy diet and regular exercise- you're doing great! Call with any questions or concerns Happy Spring!!!

## 2017-04-29 NOTE — Progress Notes (Signed)
   Subjective:    Patient ID: Brandy May, female    DOB: 1955-04-02, 62 y.o.   MRN: [id number]  HPI HTN- chronic problem, on Amlodipine 5mg  daily w/ adequate control.  No CP, SOB, HAs, visual changes, edema.  Hyperlipidemia- chronic problem, on Crestor 10mg  daily.  Pt continues to exercise regularly- just completed her 8K race.  No abd pain, N/v.   Review of Systems For ROS see HPI     Objective:   Physical Exam  Constitutional: She is oriented to person, place, and time. She appears well-developed and well-nourished. No distress.  HENT:  Head: Normocephalic and atraumatic.  Eyes: Pupils are equal, round, and reactive to light. Conjunctivae and EOM are normal.  Neck: Normal range of motion. Neck supple. No thyromegaly present.  Cardiovascular: Normal rate, regular rhythm, normal heart sounds and intact distal pulses.  No murmur heard. Pulmonary/Chest: Effort normal and breath sounds normal. No respiratory distress.  Abdominal: Soft. She exhibits no distension. There is no tenderness.  Musculoskeletal: She exhibits no edema.  Lymphadenopathy:    She has no cervical adenopathy.  Neurological: She is alert and oriented to person, place, and time.  Skin: Skin is warm and dry.  Psychiatric: She has a normal mood and affect. Her behavior is normal.  Vitals reviewed.         Assessment & Plan:

## 2017-04-29 NOTE — Assessment & Plan Note (Signed)
Chronic problem.  Tolerating statin w/o difficulty.  Check labs.  Adjust meds prn  

## 2017-04-29 NOTE — Assessment & Plan Note (Signed)
Chronic problem.  Adequate control.  Asymptomatic.  Check labs.  No anticipated med changes 

## 2017-05-19 ENCOUNTER — Other Ambulatory Visit: Payer: Self-pay | Admitting: Family Medicine

## 2017-06-01 ENCOUNTER — Ambulatory Visit
Admission: RE | Admit: 2017-06-01 | Discharge: 2017-06-01 | Disposition: A | Discharge status: Home / Self Care | Payer: Commercial Managed Care - PPO | Source: Ambulatory Visit | Attending: Obstetrics and Gynecology | Admitting: Obstetrics and Gynecology

## 2017-06-01 DIAGNOSIS — R928 Other abnormal and inconclusive findings on diagnostic imaging of breast: Secondary | ICD-10-CM | POA: Diagnosis not present

## 2017-06-01 DIAGNOSIS — N6489 Other specified disorders of breast: Secondary | ICD-10-CM

## 2017-06-25 DIAGNOSIS — K5730 Diverticulosis of large intestine without perforation or abscess without bleeding: Secondary | ICD-10-CM | POA: Diagnosis not present

## 2017-06-25 DIAGNOSIS — D125 Benign neoplasm of sigmoid colon: Secondary | ICD-10-CM | POA: Diagnosis not present

## 2017-06-25 DIAGNOSIS — K635 Polyp of colon: Secondary | ICD-10-CM | POA: Diagnosis not present

## 2017-06-25 DIAGNOSIS — Z1211 Encounter for screening for malignant neoplasm of colon: Secondary | ICD-10-CM | POA: Diagnosis not present

## 2017-06-25 LAB — HM COLONOSCOPY

## 2017-11-05 ENCOUNTER — Other Ambulatory Visit: Payer: Self-pay | Admitting: Obstetrics and Gynecology

## 2017-11-05 DIAGNOSIS — N6489 Other specified disorders of breast: Secondary | ICD-10-CM

## 2017-11-20 ENCOUNTER — Encounter: Payer: Self-pay | Admitting: Family Medicine

## 2017-11-20 ENCOUNTER — Other Ambulatory Visit: Payer: Self-pay

## 2017-11-20 ENCOUNTER — Ambulatory Visit (INDEPENDENT_AMBULATORY_CARE_PROVIDER_SITE_OTHER): Payer: Commercial Managed Care - PPO | Admitting: Family Medicine

## 2017-11-20 VITALS — BP 124/82 | HR 79 | Temp 98.1°F | Resp 16 | Ht 66.0 in | Wt 163.4 lb

## 2017-11-20 DIAGNOSIS — E559 Vitamin D deficiency, unspecified: Secondary | ICD-10-CM

## 2017-11-20 DIAGNOSIS — I10 Essential (primary) hypertension: Secondary | ICD-10-CM

## 2017-11-20 DIAGNOSIS — Z0000 Encounter for general adult medical examination without abnormal findings: Secondary | ICD-10-CM | POA: Diagnosis not present

## 2017-11-20 DIAGNOSIS — Z23 Encounter for immunization: Secondary | ICD-10-CM | POA: Diagnosis not present

## 2017-11-20 LAB — BASIC METABOLIC PANEL
BUN: 17 mg/dL (ref 6–23)
CALCIUM: 9.7 mg/dL (ref 8.4–10.5)
CO2: 30 mEq/L (ref 19–32)
Chloride: 104 mEq/L (ref 96–112)
Creatinine, Ser: 0.84 mg/dL (ref 0.40–1.20)
GFR: 88.74 mL/min (ref >60.00)
GLUCOSE: 101 mg/dL — AB (ref 70–99)
POTASSIUM: 3.9 meq/L (ref 3.5–5.1)
SODIUM: 141 meq/L (ref 135–145)

## 2017-11-20 LAB — HEPATIC FUNCTION PANEL
ALK PHOS: 65 U/L (ref 39–117)
ALT: 11 U/L (ref 0–35)
AST: 15 U/L (ref 0–37)
Albumin: 4.4 g/dL (ref 3.5–5.2)
BILIRUBIN DIRECT: 0.1 mg/dL (ref 0.0–0.3)
BILIRUBIN TOTAL: 0.6 mg/dL (ref 0.2–1.2)
Total Protein: 7.6 g/dL (ref 6.0–8.3)

## 2017-11-20 LAB — CBC WITH DIFFERENTIAL/PLATELET
BASOS PCT: 0.8 % (ref 0.0–3.0)
Basophils Absolute: 0 10*3/uL (ref 0.0–0.1)
EOS PCT: 2.5 % (ref 0.0–5.0)
Eosinophils Absolute: 0.1 10*3/uL (ref 0.0–0.7)
HEMATOCRIT: 41.3 % (ref 36.0–46.0)
Hemoglobin: 13.7 g/dL (ref 12.0–15.0)
LYMPHS ABS: 1.2 10*3/uL (ref 0.7–4.0)
LYMPHS PCT: 46.2 % — AB (ref 12.0–46.0)
MCHC: 33.1 g/dL (ref 30.0–36.0)
MCV: 98.4 fl (ref 78.0–100.0)
MONOS PCT: 7.3 % (ref 3.0–12.0)
Monocytes Absolute: 0.2 10*3/uL (ref 0.1–1.0)
NEUTROS ABS: 1.1 10*3/uL — AB (ref 1.4–7.7)
NEUTROS PCT: 43.2 % (ref 43.0–77.0)
PLATELETS: 179 10*3/uL (ref 150.0–400.0)
RBC: 4.19 Mil/uL (ref 3.87–5.11)
RDW: 13.6 % (ref 11.5–15.5)
WBC: 2.6 10*3/uL — ABNORMAL LOW (ref 4.0–10.5)

## 2017-11-20 LAB — LIPID PANEL
CHOL/HDL RATIO: 3
CHOLESTEROL: 142 mg/dL (ref 0–200)
HDL: 49.2 mg/dL (ref >39.00)
LDL CALC: 82 mg/dL (ref 0–99)
NONHDL: 93.28
Triglycerides: 54 mg/dL (ref 0.0–149.0)
VLDL: 10.8 mg/dL (ref 0.0–40.0)

## 2017-11-20 LAB — TSH: TSH: 1.8 u[IU]/mL (ref 0.35–4.50)

## 2017-11-20 LAB — VITAMIN D 25 HYDROXY (VIT D DEFICIENCY, FRACTURES): VITD: 25.06 ng/mL — ABNORMAL LOW (ref 30.00–100.00)

## 2017-11-20 MED ORDER — ROSUVASTATIN CALCIUM 10 MG PO TABS: 10.0000 mg | ORAL_TABLET | Freq: Every day | ORAL | 1 refills | Status: DC

## 2017-11-20 MED ORDER — AMLODIPINE BESYLATE 5 MG PO TABS: 5.0000 mg | ORAL_TABLET | Freq: Every day | ORAL | 1 refills | Status: DC

## 2017-11-20 NOTE — Assessment & Plan Note (Signed)
Pt's PE WNL.  UTD on colonoscopy, GYN, Tdap.  Flu given today.  Check labs.  Anticipatory guidance provided.

## 2017-11-20 NOTE — Assessment & Plan Note (Signed)
Pt w/ hx of this.  Check labs.  Replete prn. 

## 2017-11-20 NOTE — Progress Notes (Signed)
   Subjective:    Patient ID: Brandy May, female    DOB: [birth year], 62 y.o.   MRN: [id number]  HPI CPE- UTD on colonoscopy, mammo, and pap is scheduled for December.  UTD on Tdap, flu today.     Review of Systems Patient reports no vision/ hearing changes, adenopathy,fever, weight change,  persistant/recurrent hoarseness , swallowing issues, chest pain, palpitations, edema, persistant/recurrent cough, hemoptysis, dyspnea (rest/exertional/paroxysmal nocturnal), gastrointestinal bleeding (melena, rectal bleeding), abdominal pain, significant heartburn, bowel changes, GU symptoms (dysuria, hematuria, incontinence), Gyn symptoms (abnormal  bleeding, pain),  syncope, focal weakness, memory loss, numbness & tingling, skin/hair/nail changes, abnormal bruising or bleeding, anxiety, or depression.     Objective:   Physical Exam General Appearance:    Alert, cooperative, no distress, appears stated age  Head:    Normocephalic, without obvious abnormality, atraumatic  Eyes:    PERRL, conjunctiva/corneas clear, EOM's intact, fundi    benign, both eyes  Ears:    Normal TM's and external ear canals, both ears  Nose:   Nares normal, septum midline, mucosa normal, no drainage    or sinus tenderness  Throat:   Lips, mucosa, and tongue normal; teeth and gums normal  Neck:   Supple, symmetrical, trachea midline, no adenopathy;    Thyroid: no enlargement/tenderness/nodules  Back:     Symmetric, no curvature, ROM normal, no CVA tenderness  Lungs:     Clear to auscultation bilaterally, respirations unlabored  Chest Wall:    No tenderness or deformity   Heart:    Regular rate and rhythm, S1 and S2 normal, no murmur, rub   or gallop  Breast Exam:    Deferred to GYN  Abdomen:     Soft, non-tender, bowel sounds active all four quadrants,    no masses, no organomegaly  Genitalia:    Deferred to GYN  Rectal:    Extremities:   Extremities normal, atraumatic, no cyanosis or edema  Pulses:   2+ and symmetric  all extremities  Skin:   Skin color, texture, turgor normal, no rashes or lesions  Lymph nodes:   Cervical, supraclavicular, and axillary nodes normal  Neurologic:   CNII-XII intact, normal strength, sensation and reflexes    throughout          Assessment & Plan:

## 2017-11-20 NOTE — Assessment & Plan Note (Signed)
Chronic problem.  Well controlled.  Asymptomatic.  Check labs.  No anticipated med changes.  Will follow. 

## 2017-11-20 NOTE — Patient Instructions (Signed)
Follow up in 6 months to recheck BP/cholesterol We'll notify you of your lab results and make any changes if needed Continue to work on healthy diet and regular exercise- you look great!! Call with any questions or concerns Happy Fall!!!

## 2017-11-23 ENCOUNTER — Other Ambulatory Visit: Payer: Self-pay | Admitting: General Practice

## 2017-11-23 MED ORDER — VITAMIN D (ERGOCALCIFEROL) 1.25 MG (50000 UNIT) PO CAPS: 50000.0000 [IU] | ORAL_CAPSULE | ORAL | 0 refills | Status: DC

## 2017-12-03 ENCOUNTER — Other Ambulatory Visit: Payer: Self-pay | Admitting: Obstetrics and Gynecology

## 2017-12-03 ENCOUNTER — Ambulatory Visit
Admission: RE | Admit: 2017-12-03 | Discharge: 2017-12-03 | Disposition: A | Discharge status: Home / Self Care | Payer: Commercial Managed Care - PPO | Source: Ambulatory Visit | Attending: Obstetrics and Gynecology | Admitting: Obstetrics and Gynecology

## 2017-12-03 DIAGNOSIS — Z1231 Encounter for screening mammogram for malignant neoplasm of breast: Secondary | ICD-10-CM

## 2017-12-03 DIAGNOSIS — N6489 Other specified disorders of breast: Secondary | ICD-10-CM

## 2017-12-31 ENCOUNTER — Ambulatory Visit: Payer: Self-pay

## 2017-12-31 NOTE — Telephone Encounter (Signed)
See Previous note This encounter was created in error - please disregard.

## 2017-12-31 NOTE — Telephone Encounter (Signed)
Incoming call from patient with complaint Pain in her groin radiating down to her calf.  Patient denies numbness.  Patient  States that  Started about a month ago  States that it is of and on.  Rates the pain Mild to moderate.  Patient states that she walks her dog 1/2 to 1 mile. That maybe a cause.  Patient states that she just wants to make sure that she doesn't have a blood clot.  She has no idea what is causing the pain.  Patient is concerned because it feel like the leg may go out.  Scheduled appointment for 12/189/19 at 4:00 patient to arrive at 3:45pm. Provided care advice.  Voiced Understanding.     Reason for Disposition . [1] MILD pain (e.g., does not interfere with normal activities) AND [2] present > 7 days  Answer Assessment - Initial Assessment Questions 1. ONSET: "When did the pain start?"       Off and on for a little while r a month 2. LOCATION: "Where is the pain located?"      See notes 3. PAIN: "How bad is the pain?"    (Scale 1-10; or mild, moderate, severe)   -  MILD (1-3): doesn't interfere with normal activities    -  MODERATE (4-7): interferes with normal activities (e.g., work or school) or awakens from sleep, limping    -  SEVERE (8-10): excruciating pain, unable to do any normal activities, unable to walk     Mild sometimes it can get moderate  4. WORK OR EXERCISE: "Has there been any recent work or exercise that involved this part of the body?"      Walking dog 1/2 mile to mile.  5. CAUSE: "What do you think is causing the leg pain?"    I don't. Concern not having a blood clot.  6. OTHER SYMPTOMS: "Do you have any other symptoms?" (e.g., chest pain, back pain, breathing difficulty, swelling, rash, fever, numbness, weakness)     No numbness sometime feels like the leg is going to give out.   7. PREGNANCY: "Is there any chance you are pregnant?" "When was your last menstrual period?"     na  Protocols used: LEG PAIN-A-AH

## 2017-12-31 NOTE — Telephone Encounter (Signed)
Returned call to patient. Left VM to call office to discuss symptoms.

## 2018-01-04 DIAGNOSIS — Z01419 Encounter for gynecological examination (general) (routine) without abnormal findings: Secondary | ICD-10-CM | POA: Diagnosis not present

## 2018-01-04 DIAGNOSIS — Z6827 Body mass index (BMI) 27.0-27.9, adult: Secondary | ICD-10-CM | POA: Diagnosis not present

## 2018-01-04 LAB — HM PAP SMEAR

## 2018-01-13 ENCOUNTER — Encounter: Payer: Self-pay | Admitting: Family Medicine

## 2018-01-13 ENCOUNTER — Other Ambulatory Visit: Payer: Self-pay

## 2018-01-13 ENCOUNTER — Ambulatory Visit (INDEPENDENT_AMBULATORY_CARE_PROVIDER_SITE_OTHER): Payer: Commercial Managed Care - PPO | Admitting: Family Medicine

## 2018-01-13 VITALS — BP 118/81 | HR 84 | Temp 98.0°F | Resp 16 | Ht 66.0 in | Wt 167.5 lb

## 2018-01-13 DIAGNOSIS — R1032 Left lower quadrant pain: Secondary | ICD-10-CM

## 2018-01-13 MED ORDER — MELOXICAM 15 MG PO TABS: 15.0000 mg | ORAL_TABLET | Freq: Every day | ORAL | 0 refills | Status: DC

## 2018-01-13 NOTE — Progress Notes (Signed)
   Subjective:    Patient ID: Brandy May, female    DOB: 27-Apr-1955, 62 y.o.   MRN: [id number]  HPI Groin pain- L leg, previously mentioned groin pain but it was intermittent.  Pain is now constant and preventing her from sleeping at night.  Will have pain w/ both sitting and standing.  Some relief w/ Naproxen but 'it took a long time'.  No numbness/tingling.  Pain will radiate from groin into thigh and back of lower leg.  No swelling.     Review of Systems For ROS see HPI     Objective:   Physical Exam Vitals signs reviewed.  Constitutional:      General: She is not in acute distress.    Appearance: Normal appearance.  HENT:     Head: Normocephalic and atraumatic.  Cardiovascular:     Pulses: Normal pulses.  Musculoskeletal:        General: No swelling (no swelling of L LE), tenderness (no TTP over L hip or groin) or deformity.     Right lower leg: No edema.     Left lower leg: No edema.     Comments: Full ROM of L hip- no pain w/ flexion/extension, internal/external rotation  Skin:    General: Skin is warm and dry.     Findings: No erythema.  Neurological:     General: No focal deficit present.     Mental Status: She is alert. She is disoriented.     Sensory: No sensory deficit.     Motor: No weakness.     Coordination: Coordination normal.     Gait: Gait normal.     Deep Tendon Reflexes: Reflexes normal.           Assessment & Plan:  Groin pain- worsening recently.  Pt's pain is concerning for hip pain.  No redness, swelling to suggest DVT.  Pulses are strong and equal- making vascular issues less likely.  Start daily NSAID to improve pain/inflammation.  Refer to ortho.  Reviewed supportive care and red flags that should prompt return.  Pt expressed understanding and is in agreement w/ plan.

## 2018-01-13 NOTE — Patient Instructions (Signed)
Follow up as needed or as scheduled We'll call you with your Ortho appt START the once daily Meloxicam for pain/inflammation- take w/ food ICE as needed for pain Call with any questions or concerns Hang in there! Happy Holidays!!

## 2018-01-22 DIAGNOSIS — M25552 Pain in left hip: Secondary | ICD-10-CM | POA: Diagnosis not present

## 2018-01-22 DIAGNOSIS — M1612 Unilateral primary osteoarthritis, left hip: Secondary | ICD-10-CM | POA: Diagnosis not present

## 2018-02-05 ENCOUNTER — Other Ambulatory Visit: Payer: Self-pay | Admitting: Family Medicine

## 2018-05-20 ENCOUNTER — Encounter: Payer: Self-pay | Admitting: Family Medicine

## 2018-05-20 ENCOUNTER — Ambulatory Visit: Payer: Commercial Managed Care - PPO | Admitting: Family Medicine

## 2018-05-20 ENCOUNTER — Other Ambulatory Visit: Payer: Self-pay

## 2018-05-20 ENCOUNTER — Ambulatory Visit (INDEPENDENT_AMBULATORY_CARE_PROVIDER_SITE_OTHER): Payer: Commercial Managed Care - PPO | Admitting: Family Medicine

## 2018-05-20 VITALS — BP 138/78 | HR 70 | Temp 96.8°F | Ht 66.0 in | Wt 169.0 lb

## 2018-05-20 DIAGNOSIS — E785 Hyperlipidemia, unspecified: Secondary | ICD-10-CM

## 2018-05-20 DIAGNOSIS — M1610 Unilateral primary osteoarthritis, unspecified hip: Secondary | ICD-10-CM

## 2018-05-20 DIAGNOSIS — I10 Essential (primary) hypertension: Secondary | ICD-10-CM

## 2018-05-20 MED ORDER — MELOXICAM 15 MG PO TABS: 15.0000 mg | ORAL_TABLET | Freq: Every day | ORAL | 1 refills | Status: DC

## 2018-05-20 NOTE — Progress Notes (Signed)
   Virtual Visit via Video   I connected with patient on 05/20/18 at  3:20 PM EDT by a video enabled telemedicine application and verified that I am speaking with the correct person using two identifiers.  Location patient: Home Location provider: [facility] Persons participating in the virtual visit: Patient, Provider, [place] ([place])  I discussed the limitations of evaluation and management by telemedicine and the availability of in person appointments. The patient expressed understanding and agreed to proceed.  Subjective:   HPI:   HTN- chronic problem, on Amlodipine 5mg  daily w/ adequate control.  Walking regularly.  No CP, SOB, HAs, visual changes, edema.  Hyperlipidemia- chronic problem, on Crestor 10mg  nightly.  No abd pain, N/V.  Hip arthritis- saw Ortho.  They recommended an injxn but pt was not interested.  They recommended Meloxicam and/or tylenol as needed.  Needs refill on Meloxicam.  ROS:   See pertinent positives and negatives per HPI.  Patient Active Problem List   Diagnosis Date Noted  . Vitamin D deficiency 10/24/2016  . Bilateral low back pain without sciatica 07/17/2014  . Pain, joint, hand 06/11/2012  . Physical exam, annual 10/03/2011  . Thyromegaly 04/11/2011  . HTN (hypertension) 12/29/2010  . Hyperlipidemia 12/29/2010  . B12 deficiency 12/29/2010  . Maternal DVT (deep vein thrombosis), history of 12/29/2010    Social History   Tobacco Use  . Smoking status: Never Smoker  . Smokeless tobacco: Never Used  Substance Use Topics  . Alcohol use: No    Current Outpatient Medications:  .  amLODipine (NORVASC) 5 MG tablet, Take 1 tablet (5 mg total) by mouth daily., Disp: 90 tablet, Rfl: 1 .  Estradiol (VAGIFEM) 10 MCG TABS vaginal tablet, Place vaginally., Disp: , Rfl:  .  rosuvastatin (CRESTOR) 10 MG tablet, Take 1 tablet (10 mg total) by mouth daily., Disp: 90 tablet, Rfl: 1 .  meloxicam (MOBIC) 15 MG tablet, Take 1 tablet  (15 mg total) by mouth daily., Disp: 30 tablet, Rfl: 0 .  Vitamin D, Ergocalciferol, (DRISDOL) 50000 units CAPS capsule, Take 1 capsule (50,000 Units total) by mouth every 7 (seven) days., Disp: 12 capsule, Rfl: 0  Allergies  Allergen Reactions  . Kiwi Extract Swelling    Throat  . Coumadin [Warfarin Sodium] Itching    Severe Itching     Objective:   BP 138/78   Pulse 70   Temp (!) 96.8 F (36 C)   Ht 5\' 6"  (1.676 m)   Wt 169 lb (76.7 kg)   LMP 01/13/2012   SpO2 98%   BMI 27.28 kg/m   AAOx3, NAD NCAT, EOMI No obvious CN deficits Coloring WNL Pt is able to speak clearly, coherently without shortness of breath or increased work of breathing.  Thought process is linear.  Mood is appropriate.   Assessment and Plan:   HTN- well controlled.  Asymptomatic.  No need for med changes at this time.  Will follow.  Hyperlipidemia- chronic problem.  Tolerating statin w/o difficulty.  Check labs.  Adjust meds prn   Hip arthritis- new.  Pt needs refill for Meloxicam to use PRN.  Script sent.   Annye Asa, MD 05/20/2018

## 2018-05-20 NOTE — Progress Notes (Signed)
I have discussed the procedure for the virtual visit with the patient who has given consent to proceed with assessment and treatment.   BETHANY DILLARD, CMA     

## 2018-05-21 ENCOUNTER — Ambulatory Visit: Payer: Commercial Managed Care - PPO | Admitting: Family Medicine

## 2018-05-24 ENCOUNTER — Other Ambulatory Visit (INDEPENDENT_AMBULATORY_CARE_PROVIDER_SITE_OTHER): Payer: Commercial Managed Care - PPO

## 2018-05-24 DIAGNOSIS — E785 Hyperlipidemia, unspecified: Secondary | ICD-10-CM

## 2018-05-24 DIAGNOSIS — I10 Essential (primary) hypertension: Secondary | ICD-10-CM | POA: Diagnosis not present

## 2018-05-24 LAB — CBC WITH DIFFERENTIAL/PLATELET
Basophils Absolute: 0.1 10*3/uL (ref 0.0–0.1)
Basophils Relative: 1.5 % (ref 0.0–3.0)
Eosinophils Absolute: 0.1 10*3/uL (ref 0.0–0.7)
Eosinophils Relative: 2.3 % (ref 0.0–5.0)
HCT: 39.8 % (ref 36.0–46.0)
Hemoglobin: 13.4 g/dL (ref 12.0–15.0)
Lymphocytes Relative: 45.3 % (ref 12.0–46.0)
Lymphs Abs: 1.6 10*3/uL (ref 0.7–4.0)
MCHC: 33.6 g/dL (ref 30.0–36.0)
MCV: 98.3 fl (ref 78.0–100.0)
Monocytes Absolute: 0.3 10*3/uL (ref 0.1–1.0)
Monocytes Relative: 8.1 % (ref 3.0–12.0)
Neutro Abs: 1.5 10*3/uL (ref 1.4–7.7)
Neutrophils Relative %: 42.8 % — ABNORMAL LOW (ref 43.0–77.0)
Platelets: 189 10*3/uL (ref 150.0–400.0)
RBC: 4.05 Mil/uL (ref 3.87–5.11)
RDW: 13.3 % (ref 11.5–15.5)
WBC: 3.4 10*3/uL — ABNORMAL LOW (ref 4.0–10.5)

## 2018-05-24 LAB — LIPID PANEL
Cholesterol: 161 mg/dL (ref 0–200)
HDL: 50.7 mg/dL (ref >39.00)
LDL Cholesterol: 99 mg/dL (ref 0–99)
NonHDL: 110.46
Total CHOL/HDL Ratio: 3
Triglycerides: 59 mg/dL (ref 0.0–149.0)
VLDL: 11.8 mg/dL (ref 0.0–40.0)

## 2018-05-24 LAB — BASIC METABOLIC PANEL
BUN: 14 mg/dL (ref 6–23)
CO2: 29 mEq/L (ref 19–32)
Calcium: 9.2 mg/dL (ref 8.4–10.5)
Chloride: 106 mEq/L (ref 96–112)
Creatinine, Ser: 0.83 mg/dL (ref 0.40–1.20)
GFR: 84.51 mL/min (ref >60.00)
Glucose, Bld: 89 mg/dL (ref 70–99)
Potassium: 4.1 mEq/L (ref 3.5–5.1)
Sodium: 143 mEq/L (ref 135–145)

## 2018-05-24 LAB — HEPATIC FUNCTION PANEL
ALT: 12 U/L (ref 0–35)
AST: 18 U/L (ref 0–37)
Albumin: 4.2 g/dL (ref 3.5–5.2)
Alkaline Phosphatase: 61 U/L (ref 39–117)
Bilirubin, Direct: 0.1 mg/dL (ref 0.0–0.3)
Total Bilirubin: 0.8 mg/dL (ref 0.2–1.2)
Total Protein: 7.2 g/dL (ref 6.0–8.3)

## 2018-05-24 LAB — TSH: TSH: 2.42 u[IU]/mL (ref 0.35–4.50)

## 2018-05-25 ENCOUNTER — Encounter: Payer: Self-pay | Admitting: General Practice

## 2018-05-26 ENCOUNTER — Other Ambulatory Visit: Payer: Commercial Managed Care - PPO

## 2018-07-19 ENCOUNTER — Other Ambulatory Visit: Payer: Self-pay | Admitting: Family Medicine

## 2018-09-30 IMAGING — MG 2D DIGITAL SCREENING BILATERAL MAMMOGRAM WITH CAD AND ADJUNCT TO
8 of 12 series · 8 of 28 positions shown · non-contrast
Comparison: Previous exam(s).

CLINICAL DATA: Screening.

EXAM:
2D DIGITAL SCREENING BILATERAL MAMMOGRAM WITH CAD AND ADJUNCT TOMO

[R MLO synth-2D]
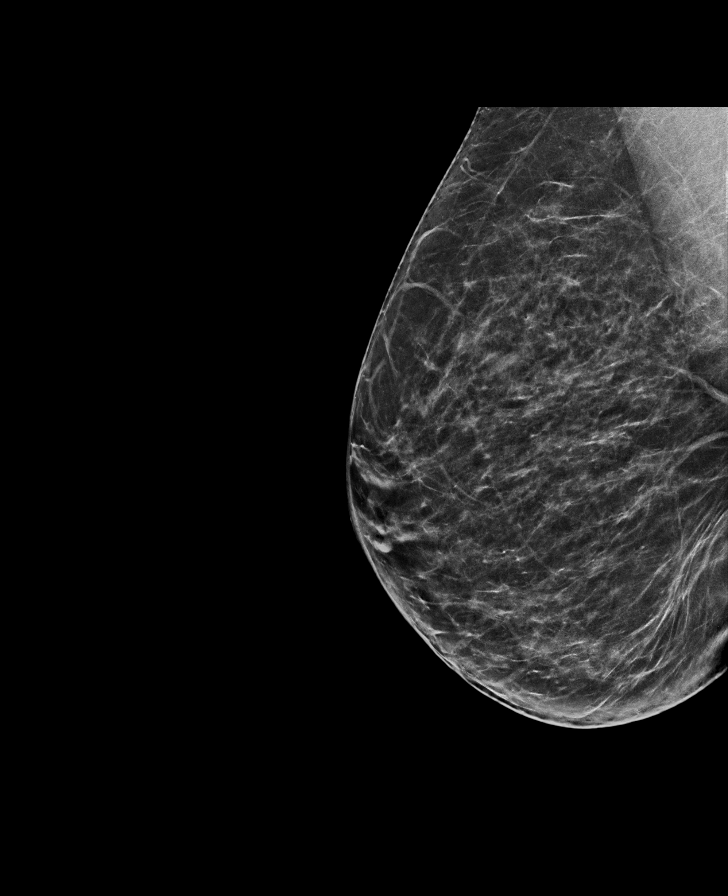

[L CC synth-2D]
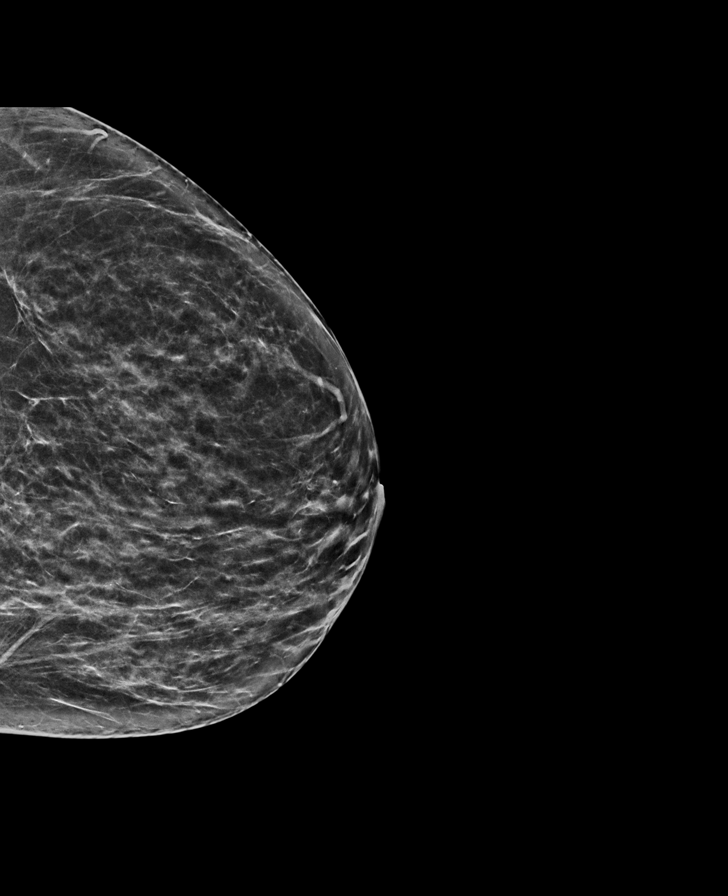

[L CC]
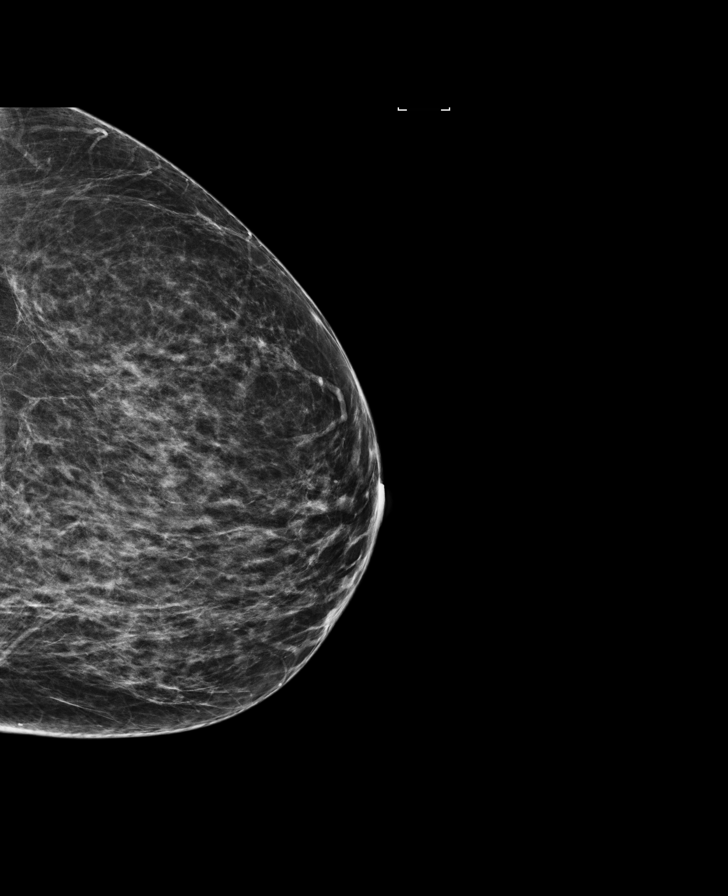

[R CC synth-2D]
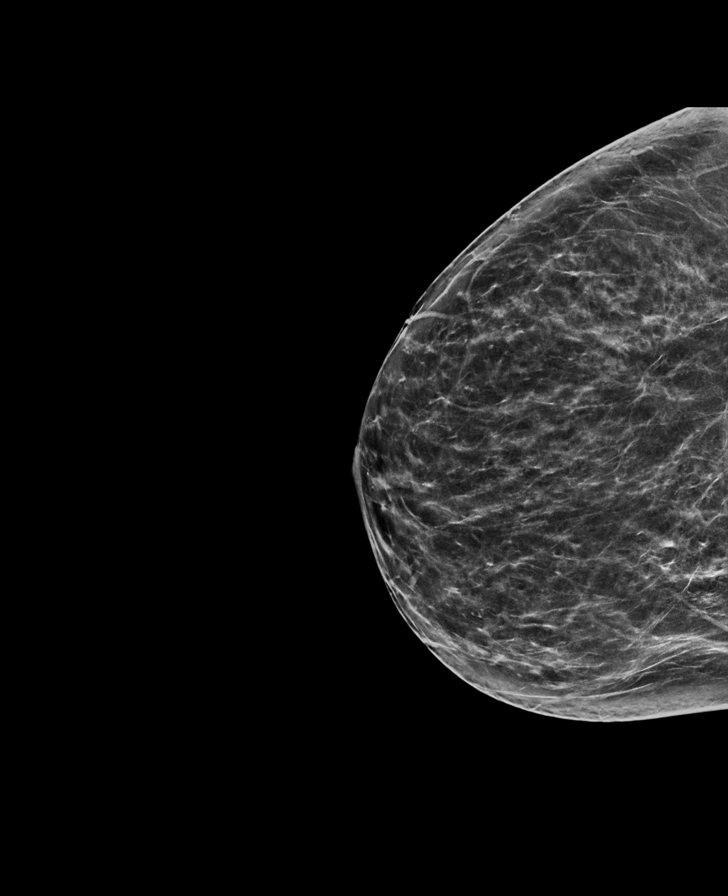

[L MLO]
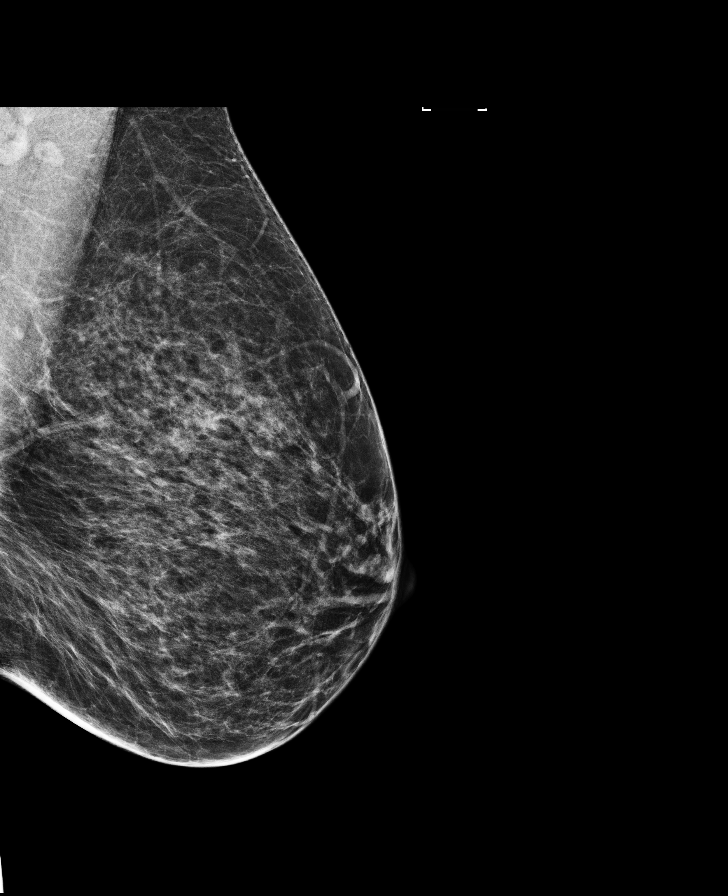

[R CC]
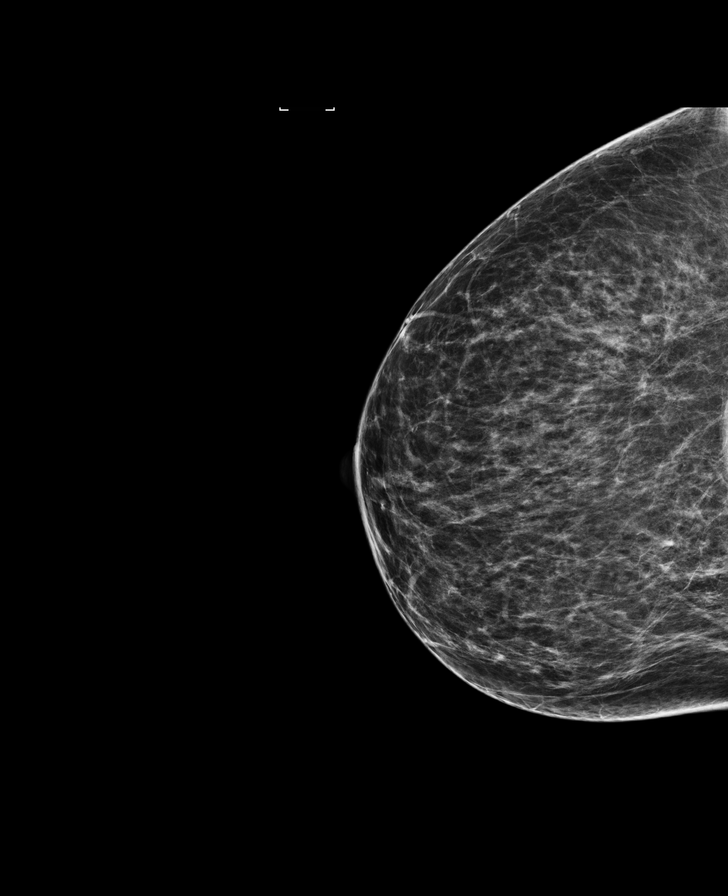

[R MLO]
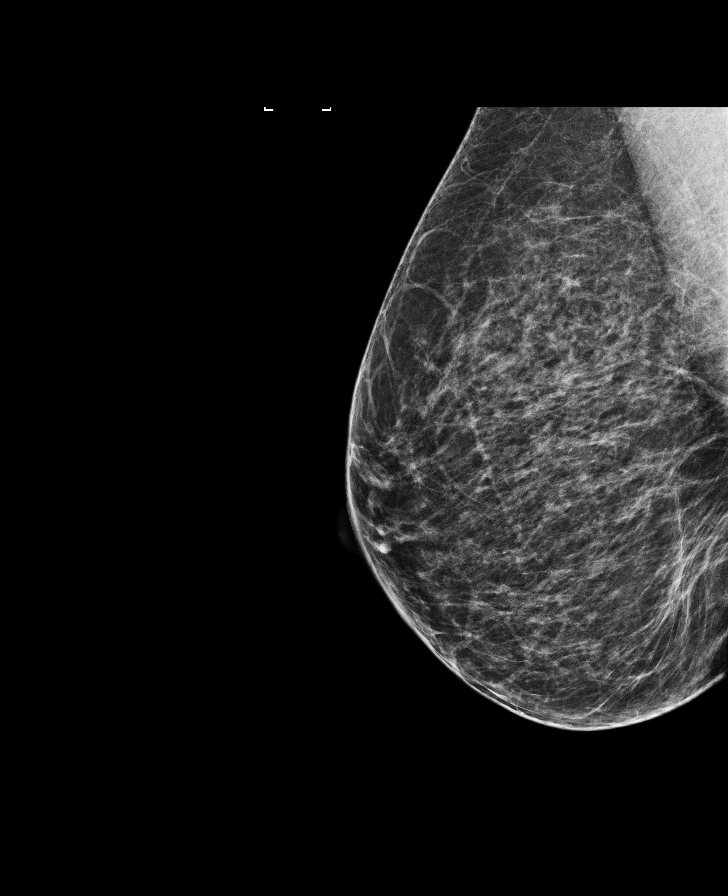

[L MLO synth-2D]
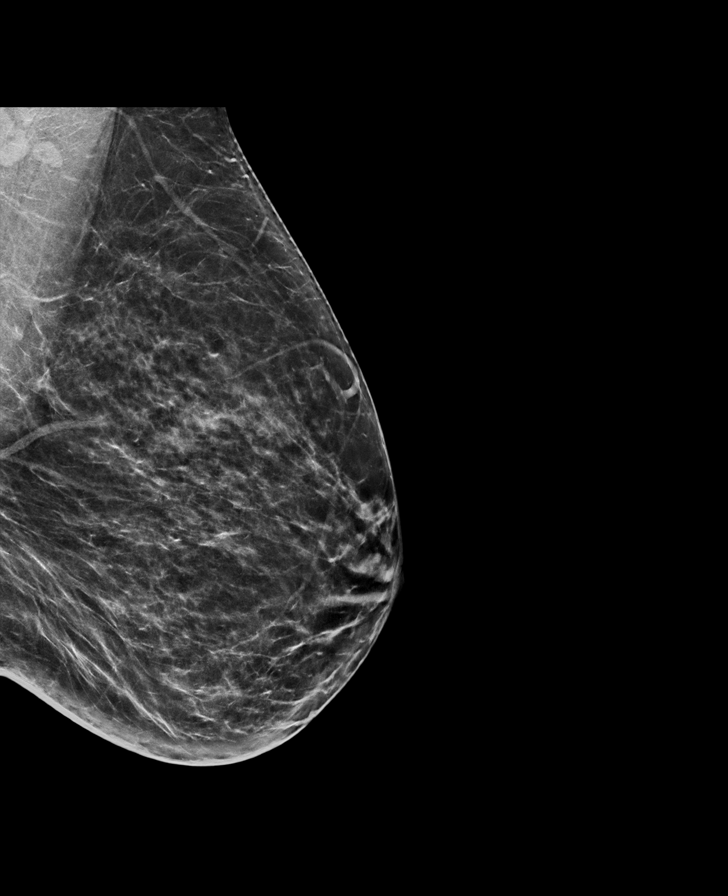

[8 of 28 positions shown; findings below may reference images not displayed]

ACR Breast Density Category b: There are scattered areas of
fibroglandular density.
FINDINGS: There are no findings suspicious for malignancy. Images were
processed with CAD.
IMPRESSION: No mammographic evidence of malignancy. A result letter of this
screening mammogram will be mailed directly to the patient.

RECOMMENDATION:
Screening mammogram in one year. (Code:97-6-RS4)

BI-RADS CATEGORY  1: Negative.

## 2018-11-18 ENCOUNTER — Other Ambulatory Visit: Payer: Self-pay

## 2018-11-18 DIAGNOSIS — Z20822 Contact with and (suspected) exposure to covid-19: Secondary | ICD-10-CM

## 2018-11-20 LAB — NOVEL CORONAVIRUS, NAA: SARS-CoV-2, NAA: NOT DETECTED

## 2018-12-08 ENCOUNTER — Other Ambulatory Visit: Payer: Self-pay | Admitting: Family Medicine

## 2018-12-08 DIAGNOSIS — Z1231 Encounter for screening mammogram for malignant neoplasm of breast: Secondary | ICD-10-CM

## 2018-12-09 ENCOUNTER — Ambulatory Visit (INDEPENDENT_AMBULATORY_CARE_PROVIDER_SITE_OTHER): Payer: Commercial Managed Care - PPO | Admitting: Family Medicine

## 2018-12-09 ENCOUNTER — Other Ambulatory Visit: Payer: Self-pay

## 2018-12-09 ENCOUNTER — Encounter: Payer: Self-pay | Admitting: Family Medicine

## 2018-12-09 VITALS — BP 121/81 | HR 64 | Temp 98.0°F | Resp 16 | Ht 66.0 in | Wt 168.5 lb

## 2018-12-09 DIAGNOSIS — I10 Essential (primary) hypertension: Secondary | ICD-10-CM | POA: Diagnosis not present

## 2018-12-09 DIAGNOSIS — Z0000 Encounter for general adult medical examination without abnormal findings: Secondary | ICD-10-CM | POA: Diagnosis not present

## 2018-12-09 DIAGNOSIS — E538 Deficiency of other specified B group vitamins: Secondary | ICD-10-CM

## 2018-12-09 DIAGNOSIS — Z9189 Other specified personal risk factors, not elsewhere classified: Secondary | ICD-10-CM

## 2018-12-09 DIAGNOSIS — Z1159 Encounter for screening for other viral diseases: Secondary | ICD-10-CM

## 2018-12-09 DIAGNOSIS — E559 Vitamin D deficiency, unspecified: Secondary | ICD-10-CM

## 2018-12-09 DIAGNOSIS — Z23 Encounter for immunization: Secondary | ICD-10-CM

## 2018-12-09 LAB — CBC WITH DIFFERENTIAL/PLATELET
Basophils Absolute: 0 10*3/uL (ref 0.0–0.1)
Basophils Relative: 0.9 % (ref 0.0–3.0)
Eosinophils Absolute: 0 10*3/uL (ref 0.0–0.7)
Eosinophils Relative: 1.8 % (ref 0.0–5.0)
HCT: 39.4 % (ref 36.0–46.0)
Hemoglobin: 12.9 g/dL (ref 12.0–15.0)
Lymphocytes Relative: 51.1 % — ABNORMAL HIGH (ref 12.0–46.0)
Lymphs Abs: 1.2 10*3/uL (ref 0.7–4.0)
MCHC: 32.8 g/dL (ref 30.0–36.0)
MCV: 97.6 fl (ref 78.0–100.0)
Monocytes Absolute: 0.2 10*3/uL (ref 0.1–1.0)
Monocytes Relative: 6.4 % (ref 3.0–12.0)
Neutro Abs: 0.9 10*3/uL — ABNORMAL LOW (ref 1.4–7.7)
Neutrophils Relative %: 39.8 % — ABNORMAL LOW (ref 43.0–77.0)
Platelets: 179 10*3/uL (ref 150.0–400.0)
RBC: 4.03 Mil/uL (ref 3.87–5.11)
RDW: 13.5 % (ref 11.5–15.5)
WBC: 2.4 10*3/uL — ABNORMAL LOW (ref 4.0–10.5)

## 2018-12-09 LAB — HEPATIC FUNCTION PANEL
ALT: 12 U/L (ref 0–35)
AST: 17 U/L (ref 0–37)
Albumin: 4.4 g/dL (ref 3.5–5.2)
Alkaline Phosphatase: 58 U/L (ref 39–117)
Bilirubin, Direct: 0.2 mg/dL (ref 0.0–0.3)
Total Bilirubin: 0.7 mg/dL (ref 0.2–1.2)
Total Protein: 7.4 g/dL (ref 6.0–8.3)

## 2018-12-09 LAB — LIPID PANEL
Cholesterol: 139 mg/dL (ref 0–200)
HDL: 49.9 mg/dL (ref >39.00)
LDL Cholesterol: 78 mg/dL (ref 0–99)
NonHDL: 89.09
Total CHOL/HDL Ratio: 3
Triglycerides: 54 mg/dL (ref 0.0–149.0)
VLDL: 10.8 mg/dL (ref 0.0–40.0)

## 2018-12-09 LAB — BASIC METABOLIC PANEL
BUN: 10 mg/dL (ref 6–23)
CO2: 29 mEq/L (ref 19–32)
Calcium: 9.3 mg/dL (ref 8.4–10.5)
Chloride: 106 mEq/L (ref 96–112)
Creatinine, Ser: 0.77 mg/dL (ref 0.40–1.20)
GFR: 91.99 mL/min (ref >60.00)
Glucose, Bld: 82 mg/dL (ref 70–99)
Potassium: 4 mEq/L (ref 3.5–5.1)
Sodium: 141 mEq/L (ref 135–145)

## 2018-12-09 LAB — TSH: TSH: 1.3 u[IU]/mL (ref 0.35–4.50)

## 2018-12-09 LAB — VITAMIN D 25 HYDROXY (VIT D DEFICIENCY, FRACTURES): VITD: 30.26 ng/mL (ref 30.00–100.00)

## 2018-12-09 LAB — VITAMIN B12: Vitamin B-12: 263 pg/mL (ref 211–911)

## 2018-12-09 NOTE — Assessment & Plan Note (Signed)
Chronic problem, well controlled.  Asymptomatic.  Check labs.  No anticipated med changes.  Will follow. 

## 2018-12-09 NOTE — Progress Notes (Signed)
   Subjective:    Patient ID: Brandy May, female    DOB: 10-10-1955, 63 y.o.   MRN: [id number]  HPI CPE- UTD on pap, mammo (scheduled).  UTD on Tdap.  Due for flu.  UTD on colonoscopy.   Review of Systems Patient reports no vision/ hearing changes, adenopathy,fever, weight change,  persistant/recurrent hoarseness , swallowing issues, chest pain, palpitations, edema, persistant/recurrent cough, hemoptysis, dyspnea (rest/exertional/paroxysmal nocturnal), gastrointestinal bleeding (melena, rectal bleeding), abdominal pain, significant heartburn, bowel changes, GU symptoms (dysuria, hematuria, incontinence), Gyn symptoms (abnormal  bleeding, pain),  syncope, focal weakness, memory loss, numbness & tingling, skin/hair/nail changes, abnormal bruising or bleeding, anxiety, or depression.     Objective:   Physical Exam General Appearance:    Alert, cooperative, no distress, appears stated age  Head:    Normocephalic, without obvious abnormality, atraumatic  Eyes:    PERRL, conjunctiva/corneas clear, EOM's intact, fundi    benign, both eyes  Ears:    Normal TM's and external ear canals, both ears  Nose:   Deferred due to COVID  Throat:   Neck:   Supple, symmetrical, trachea midline, no adenopathy;    Thyroid: no enlargement/tenderness/nodules  Back:     Symmetric, no curvature, ROM normal, no CVA tenderness  Lungs:     Clear to auscultation bilaterally, respirations unlabored  Chest Wall:    No tenderness or deformity   Heart:    Regular rate and rhythm, S1 and S2 normal, no murmur, rub   or gallop  Breast Exam:    Deferred to GYN  Abdomen:     Soft, non-tender, bowel sounds active all four quadrants,    no masses, no organomegaly  Genitalia:    Deferred to GYN  Rectal:    Extremities:   Extremities normal, atraumatic, no cyanosis or edema  Pulses:   2+ and symmetric all extremities  Skin:   Skin color, texture, turgor normal, no rashes or lesions  Lymph nodes:   Cervical,  supraclavicular, and axillary nodes normal  Neurologic:   CNII-XII intact, normal strength, sensation and reflexes    throughout          Assessment & Plan:

## 2018-12-09 NOTE — Assessment & Plan Note (Signed)
Check labs and replete prn. 

## 2018-12-09 NOTE — Patient Instructions (Signed)
Follow up in 6 months to recheck BP and cholesterol We'll notify you of your lab results and make any changes if needed Continue to work on healthy diet and regular exercise- you look great!! Call with any questions or concerns Stay Safe!  Stay Healthy! 

## 2018-12-09 NOTE — Assessment & Plan Note (Signed)
Pt's PE WNL.  UTD on GYN, colonoscopy, Tdap.  Flu shot given today.  Check labs.  Anticipatory guidance provided.

## 2018-12-10 ENCOUNTER — Encounter: Payer: Self-pay | Admitting: General Practice

## 2018-12-10 ENCOUNTER — Ambulatory Visit
Admission: RE | Admit: 2018-12-10 | Discharge: 2018-12-10 | Disposition: A | Discharge status: Home / Self Care | Payer: Commercial Managed Care - PPO | Source: Ambulatory Visit | Attending: Family Medicine | Admitting: Family Medicine

## 2018-12-10 DIAGNOSIS — Z1231 Encounter for screening mammogram for malignant neoplasm of breast: Secondary | ICD-10-CM

## 2018-12-10 LAB — HEPATITIS C ANTIBODY
Hepatitis C Ab: NONREACTIVE
SIGNAL TO CUT-OFF: 0.02 (ref <1.00)

## 2019-01-12 ENCOUNTER — Other Ambulatory Visit: Payer: Self-pay | Admitting: Obstetrics and Gynecology

## 2019-01-12 ENCOUNTER — Other Ambulatory Visit: Payer: Self-pay | Admitting: Family Medicine

## 2019-01-12 DIAGNOSIS — E2839 Other primary ovarian failure: Secondary | ICD-10-CM

## 2019-03-07 ENCOUNTER — Telehealth: Payer: Self-pay

## 2019-03-07 NOTE — Telephone Encounter (Signed)
Please call to check on pt and mom.  I'm so sorry to hear this!

## 2019-03-07 NOTE — Telephone Encounter (Signed)
Please call daughter and let her know that we placed referral and mom will go through the screening process regarding antibody infusion

## 2019-03-07 NOTE — Telephone Encounter (Signed)
Please advise.  Pt advised that her mom tested on 03/04/19 and positive results back this morning. Pt states that her mom has no fever and O2 is steady at 95. Did have mild Coughing about a week ago lasted about 2 days, treated with Robitussin. Would like to know if she needs to be sent for infusion due to age.   Pt tested 03/02/19 positive results on 03/04/19. Pt is experiencing some congestion yesterday, took some flonase that made this go away. She feels that her house is very dry and causing this. O2 levels 96-97. Really doesn't feel bad at all.

## 2019-03-07 NOTE — Telephone Encounter (Signed)
FYI Patient called and states that she was tested for Covid-19 on 03/02/19 and was positive but is asymptomatic. Patient states she has been in isolation at home since 03/04/19, with her mother who is also in isolation there. She can be reached at [phone].

## 2019-03-07 NOTE — Telephone Encounter (Signed)
Referral placed through phone system for pt mom. Also created a phone note in pt chart.

## 2019-04-13 ENCOUNTER — Ambulatory Visit
Admission: RE | Admit: 2019-04-13 | Discharge: 2019-04-13 | Disposition: A | Discharge status: Home / Self Care | Payer: Commercial Managed Care - PPO | Source: Ambulatory Visit | Attending: Obstetrics and Gynecology | Admitting: Obstetrics and Gynecology

## 2019-04-13 ENCOUNTER — Other Ambulatory Visit: Payer: Self-pay

## 2019-04-13 DIAGNOSIS — E2839 Other primary ovarian failure: Secondary | ICD-10-CM

## 2019-06-09 ENCOUNTER — Other Ambulatory Visit: Payer: Self-pay

## 2019-06-09 ENCOUNTER — Encounter: Payer: Self-pay | Admitting: Family Medicine

## 2019-06-09 ENCOUNTER — Ambulatory Visit (INDEPENDENT_AMBULATORY_CARE_PROVIDER_SITE_OTHER): Payer: Commercial Managed Care - PPO | Admitting: Family Medicine

## 2019-06-09 VITALS — BP 127/80 | HR 77 | Temp 97.9°F | Resp 16 | Ht 66.0 in | Wt 159.2 lb

## 2019-06-09 DIAGNOSIS — E785 Hyperlipidemia, unspecified: Secondary | ICD-10-CM | POA: Diagnosis not present

## 2019-06-09 DIAGNOSIS — I10 Essential (primary) hypertension: Secondary | ICD-10-CM | POA: Diagnosis not present

## 2019-06-09 DIAGNOSIS — Z23 Encounter for immunization: Secondary | ICD-10-CM | POA: Diagnosis not present

## 2019-06-09 DIAGNOSIS — E663 Overweight: Secondary | ICD-10-CM

## 2019-06-09 LAB — BASIC METABOLIC PANEL
BUN: 15 mg/dL (ref 6–23)
CO2: 31 mEq/L (ref 19–32)
Calcium: 8.9 mg/dL (ref 8.4–10.5)
Chloride: 106 mEq/L (ref 96–112)
Creatinine, Ser: 0.73 mg/dL (ref 0.40–1.20)
GFR: 97.67 mL/min (ref >60.00)
Glucose, Bld: 104 mg/dL — ABNORMAL HIGH (ref 70–99)
Potassium: 3.9 mEq/L (ref 3.5–5.1)
Sodium: 140 mEq/L (ref 135–145)

## 2019-06-09 LAB — CBC WITH DIFFERENTIAL/PLATELET
Basophils Absolute: 0 10*3/uL (ref 0.0–0.1)
Basophils Relative: 1.1 % (ref 0.0–3.0)
Eosinophils Absolute: 0.1 10*3/uL (ref 0.0–0.7)
Eosinophils Relative: 2.9 % (ref 0.0–5.0)
HCT: 38 % (ref 36.0–46.0)
Hemoglobin: 12.7 g/dL (ref 12.0–15.0)
Lymphocytes Relative: 42.9 % (ref 12.0–46.0)
Lymphs Abs: 1 10*3/uL (ref 0.7–4.0)
MCHC: 33.4 g/dL (ref 30.0–36.0)
MCV: 99 fl (ref 78.0–100.0)
Monocytes Absolute: 0.2 10*3/uL (ref 0.1–1.0)
Monocytes Relative: 6.9 % (ref 3.0–12.0)
Neutro Abs: 1.1 10*3/uL — ABNORMAL LOW (ref 1.4–7.7)
Neutrophils Relative %: 46.2 % (ref 43.0–77.0)
Platelets: 194 10*3/uL (ref 150.0–400.0)
RBC: 3.84 Mil/uL — ABNORMAL LOW (ref 3.87–5.11)
RDW: 13.4 % (ref 11.5–15.5)
WBC: 2.3 10*3/uL — ABNORMAL LOW (ref 4.0–10.5)

## 2019-06-09 LAB — HEPATIC FUNCTION PANEL
ALT: 12 U/L (ref 0–35)
AST: 16 U/L (ref 0–37)
Albumin: 4 g/dL (ref 3.5–5.2)
Alkaline Phosphatase: 69 U/L (ref 39–117)
Bilirubin, Direct: 0.1 mg/dL (ref 0.0–0.3)
Total Bilirubin: 0.6 mg/dL (ref 0.2–1.2)
Total Protein: 7 g/dL (ref 6.0–8.3)

## 2019-06-09 LAB — LIPID PANEL
Cholesterol: 170 mg/dL (ref 0–200)
HDL: 51.4 mg/dL (ref >39.00)
LDL Cholesterol: 107 mg/dL — ABNORMAL HIGH (ref 0–99)
NonHDL: 118.61
Total CHOL/HDL Ratio: 3
Triglycerides: 60 mg/dL (ref 0.0–149.0)
VLDL: 12 mg/dL (ref 0.0–40.0)

## 2019-06-09 LAB — TSH: TSH: 1.43 u[IU]/mL (ref 0.35–4.50)

## 2019-06-09 NOTE — Patient Instructions (Signed)
Schedule your complete physical in 6 months We'll notify you of your lab results and make any changes as needed Keep up the good work!  You look amazing! Call with any questions or concerns Have a great summer!!

## 2019-06-09 NOTE — Progress Notes (Signed)
   Subjective:    Patient ID: Brandy May, female    DOB: 06-27-1955, 64 y.o.   MRN: [id number]  HPI HTN- chronic problem, on Amlodipine 5mg  daily w/ good control.  No CP, SOB, HAs, visual changes, edmea.  Hyperlipidemia- chronic problem, on Crestor 10mg  daily.  No abd pain, N/V  Overweight- pt is down 10 lbs since last visit.  Pt joined [employer] and likes the program.  Plans to resume walking as the weather improves.   Review of Systems For ROS see HPI   This visit occurred during the SARS-CoV-2 public health emergency.  Safety protocols were in place, including screening questions prior to the visit, additional usage of staff PPE, and extensive cleaning of exam room while observing appropriate contact time as indicated for disinfecting solutions.       Objective:   Physical Exam Vitals reviewed.  Constitutional:      General: She is not in acute distress.    Appearance: Normal appearance. She is well-developed.  HENT:     Head: Normocephalic and atraumatic.  Eyes:     Conjunctiva/sclera: Conjunctivae normal.     Pupils: Pupils are equal, round, and reactive to light.  Neck:     Thyroid: No thyromegaly.  Cardiovascular:     Rate and Rhythm: Normal rate and regular rhythm.     Heart sounds: Normal heart sounds. No murmur.  Pulmonary:     Effort: Pulmonary effort is normal. No respiratory distress.     Breath sounds: Normal breath sounds.  Abdominal:     General: There is no distension.     Palpations: Abdomen is soft.     Tenderness: There is no abdominal tenderness.  Musculoskeletal:     Cervical back: Normal range of motion and neck supple.  Lymphadenopathy:     Cervical: No cervical adenopathy.  Skin:    General: Skin is warm and dry.  Neurological:     Mental Status: She is alert and oriented to person, place, and time.  Psychiatric:        Behavior: Behavior normal.           Assessment & Plan:

## 2019-06-09 NOTE — Assessment & Plan Note (Signed)
Chronic problem, well controlled today.  Asymptomatic.  Check labs.  No anticipated med changes.  Will follow. 

## 2019-06-09 NOTE — Assessment & Plan Note (Signed)
Improved.  Pt is down 10 lbs since last visit.  Applauded her efforts.  Will follow.

## 2019-06-09 NOTE — Assessment & Plan Note (Signed)
Chronic problem.  Tolerating statin w/o difficulty.  Applauded her recent weight loss.  Check labs.  Adjust meds prn

## 2019-06-10 ENCOUNTER — Encounter: Payer: Self-pay | Admitting: General Practice

## 2019-07-04 ENCOUNTER — Other Ambulatory Visit: Payer: Self-pay | Admitting: Family Medicine

## 2019-08-04 ENCOUNTER — Telehealth: Payer: Self-pay | Admitting: Family Medicine

## 2019-08-04 NOTE — Telephone Encounter (Signed)
Having more pain in her hip - Miloxicam does not seem to be working - concerned that something else could be going on.  Please Advise

## 2019-08-05 NOTE — Telephone Encounter (Signed)
Patient called back to schedule appt with PCP.

## 2019-08-05 NOTE — Telephone Encounter (Signed)
Called patient to but unable to leave a vm  message due to mailbox being full.

## 2019-08-10 ENCOUNTER — Encounter: Payer: Self-pay | Admitting: Family Medicine

## 2019-08-10 ENCOUNTER — Other Ambulatory Visit: Payer: Self-pay

## 2019-08-10 ENCOUNTER — Ambulatory Visit (INDEPENDENT_AMBULATORY_CARE_PROVIDER_SITE_OTHER): Payer: Commercial Managed Care - PPO | Admitting: Family Medicine

## 2019-08-10 VITALS — BP 120/81 | HR 78 | Temp 97.8°F | Resp 16 | Ht 66.0 in | Wt 154.1 lb

## 2019-08-10 DIAGNOSIS — M25552 Pain in left hip: Secondary | ICD-10-CM | POA: Diagnosis not present

## 2019-08-10 MED ORDER — PREDNISONE 10 MG PO TABS: ORAL_TABLET | ORAL | 0 refills | Status: DC

## 2019-08-10 NOTE — Progress Notes (Signed)
   Subjective:    Patient ID: Brandy May, female    DOB: 1955-02-14, 64 y.o.   MRN: [id number]  HPI L hip pain- this was last discussed 2019 and pt was referred to [facility].  Starting 3-4 weeks ago pt noticed pain was worsening.  Started taking Meloxicam regularly w/o improvement.  Pt is now having difficulty falling asleep or staying asleep due to pain.  Pain is worse w/ sitting compared to standing.  Pain will radiate down the front of L thigh and b/c she is limping, L knee will hurt.  Pt is now visibly limping.  No redness/warmth/swelling.   Review of Systems For ROS see HPI   This visit occurred during the SARS-CoV-2 public health emergency.  Safety protocols were in place, including screening questions prior to the visit, additional usage of staff PPE, and extensive cleaning of exam room while observing appropriate contact time as indicated for disinfecting solutions.       Objective:   Physical Exam Vitals reviewed.  Constitutional:      General: She is not in acute distress.    Appearance: Normal appearance. She is not ill-appearing.  HENT:     Head: Normocephalic and atraumatic.  Cardiovascular:     Pulses: Normal pulses.  Musculoskeletal:        General: Tenderness (TTP over L groin) present. No swelling.     Comments: Pain w/ L hip flexion, pain and limited ROM on external rotation  Neurological:     General: No focal deficit present.     Mental Status: She is alert and oriented to person, place, and time.     Cranial Nerves: No cranial nerve deficit.     Motor: No weakness.     Coordination: Coordination normal.     Gait: Gait abnormal (antalgic gait w/ visible limp).     Deep Tendon Reflexes: Reflexes normal.  Psychiatric:        Mood and Affect: Mood normal.        Behavior: Behavior normal.        Thought Content: Thought content normal.           Assessment & Plan:  L hip pain- deteriorated.  Suspect arthritis given her groin pain and now  having pain at night.  Start Prednisone and refer to ortho for complete evaluation and tx.  Pt expressed understanding and is in agreement w/ plan.

## 2019-08-10 NOTE — Patient Instructions (Signed)
Follow up as needed or as scheduled We'll call you with your Ortho appt START the Prednisone- 3 tabs at the same time x3 days, then 2 tabs at the same time x3 days, then 1 tab daily.  Take w/ food ICE!!!! Call with any questions or concerns Hang in there!!!

## 2019-08-29 ENCOUNTER — Encounter: Payer: Self-pay | Admitting: Family Medicine

## 2019-08-29 DIAGNOSIS — Z01818 Encounter for other preprocedural examination: Secondary | ICD-10-CM

## 2019-08-30 ENCOUNTER — Telehealth: Payer: Self-pay | Admitting: Family Medicine

## 2019-08-30 ENCOUNTER — Ambulatory Visit: Payer: Commercial Managed Care - PPO

## 2019-08-30 ENCOUNTER — Other Ambulatory Visit (INDEPENDENT_AMBULATORY_CARE_PROVIDER_SITE_OTHER): Payer: Commercial Managed Care - PPO

## 2019-08-30 DIAGNOSIS — Z01818 Encounter for other preprocedural examination: Secondary | ICD-10-CM | POA: Diagnosis not present

## 2019-08-30 LAB — CBC WITH DIFFERENTIAL/PLATELET
Basophils Absolute: 0 10*3/uL (ref 0.0–0.1)
Basophils Relative: 0.7 % (ref 0.0–3.0)
Eosinophils Absolute: 0.1 10*3/uL (ref 0.0–0.7)
Eosinophils Relative: 2.7 % (ref 0.0–5.0)
HCT: 38 % (ref 36.0–46.0)
Hemoglobin: 12.5 g/dL (ref 12.0–15.0)
Lymphocytes Relative: 39 % (ref 12.0–46.0)
Lymphs Abs: 1 10*3/uL (ref 0.7–4.0)
MCHC: 32.9 g/dL (ref 30.0–36.0)
MCV: 99.7 fl (ref 78.0–100.0)
Monocytes Absolute: 0.2 10*3/uL (ref 0.1–1.0)
Monocytes Relative: 9 % (ref 3.0–12.0)
Neutro Abs: 1.2 10*3/uL — ABNORMAL LOW (ref 1.4–7.7)
Neutrophils Relative %: 48.6 % (ref 43.0–77.0)
Platelets: 167 10*3/uL (ref 150.0–400.0)
RBC: 3.81 Mil/uL — ABNORMAL LOW (ref 3.87–5.11)
RDW: 13.7 % (ref 11.5–15.5)
WBC: 2.5 10*3/uL — ABNORMAL LOW (ref 4.0–10.5)

## 2019-08-30 LAB — BASIC METABOLIC PANEL
BUN: 15 mg/dL (ref 6–23)
CO2: 29 mEq/L (ref 19–32)
Calcium: 9.2 mg/dL (ref 8.4–10.5)
Chloride: 108 mEq/L (ref 96–112)
Creatinine, Ser: 0.75 mg/dL (ref 0.40–1.20)
GFR: 94.61 mL/min (ref >60.00)
Glucose, Bld: 102 mg/dL — ABNORMAL HIGH (ref 70–99)
Potassium: 4 mEq/L (ref 3.5–5.1)
Sodium: 141 mEq/L (ref 135–145)

## 2019-08-30 NOTE — Telephone Encounter (Signed)
Placed pre-operative risk assessment in Dr. Virgil Benedict bin.  - Patient is coming in 08/31/2019 for an EKG

## 2019-08-30 NOTE — Telephone Encounter (Signed)
They should be able to add it on now that the add on sheet was faxed

## 2019-08-30 NOTE — Telephone Encounter (Signed)
Brandy May from the lab called the protime can't be added because it wasn't ordered at future. Please advise

## 2019-08-30 NOTE — Telephone Encounter (Signed)
Faxed lab add on sheet.

## 2019-08-30 NOTE — Telephone Encounter (Signed)
Please advise? I also faxed a lab order add on sheet. I am not really sure.

## 2019-08-30 NOTE — Telephone Encounter (Signed)
Paperwork given to PCP, EKG ordered.

## 2019-08-31 ENCOUNTER — Other Ambulatory Visit: Payer: Commercial Managed Care - PPO

## 2019-08-31 ENCOUNTER — Other Ambulatory Visit: Payer: Self-pay

## 2019-08-31 ENCOUNTER — Ambulatory Visit (INDEPENDENT_AMBULATORY_CARE_PROVIDER_SITE_OTHER): Payer: Commercial Managed Care - PPO

## 2019-08-31 DIAGNOSIS — Z01818 Encounter for other preprocedural examination: Secondary | ICD-10-CM | POA: Diagnosis not present

## 2019-08-31 LAB — PROTIME-INR
INR: 1 ratio (ref 0.8–1.0)
Prothrombin Time: 11.4 s (ref 9.6–13.1)

## 2019-08-31 NOTE — Telephone Encounter (Signed)
Paperwork completed.  EKG attached

## 2019-08-31 NOTE — Telephone Encounter (Signed)
We still need to add a lab order result to that paperwork. Can we keep it close to resend tomorrow?

## 2019-08-31 NOTE — Progress Notes (Addendum)
Brandy May, 64 y.o. female presents to the office for a pre-op EKG. EKG completed and reviewed by PCP. Patient left the office in good condition. Fritz Pickerel, LPN  Pt EKG WNL- cleared for surgery.  Annye Asa, MD

## 2019-08-31 NOTE — Addendum Note (Signed)
Addended by: Midge Minium on: 08/31/2019 11:55 AM   Modules accepted: Level of Service

## 2019-09-05 ENCOUNTER — Telehealth: Payer: Self-pay | Admitting: Family Medicine

## 2019-09-05 NOTE — Telephone Encounter (Signed)
Patient was referred over.    [facility] is needing last OV notes.  They have received last labs.    Fax number is [phone].

## 2019-12-14 ENCOUNTER — Other Ambulatory Visit: Payer: Self-pay | Admitting: Family Medicine

## 2019-12-14 ENCOUNTER — Other Ambulatory Visit (HOSPITAL_BASED_OUTPATIENT_CLINIC_OR_DEPARTMENT_OTHER): Payer: Self-pay | Admitting: Family Medicine

## 2019-12-14 DIAGNOSIS — Z1231 Encounter for screening mammogram for malignant neoplasm of breast: Secondary | ICD-10-CM

## 2019-12-15 ENCOUNTER — Encounter: Payer: Commercial Managed Care - PPO | Admitting: Family Medicine

## 2019-12-19 ENCOUNTER — Other Ambulatory Visit: Payer: Self-pay

## 2019-12-19 ENCOUNTER — Encounter (HOSPITAL_BASED_OUTPATIENT_CLINIC_OR_DEPARTMENT_OTHER): Payer: Self-pay

## 2019-12-19 ENCOUNTER — Ambulatory Visit (HOSPITAL_BASED_OUTPATIENT_CLINIC_OR_DEPARTMENT_OTHER)
Admission: RE | Admit: 2019-12-19 | Discharge: 2019-12-19 | Disposition: A | Discharge status: Home / Self Care | Payer: Commercial Managed Care - PPO | Source: Ambulatory Visit | Attending: Family Medicine | Admitting: Family Medicine

## 2019-12-19 DIAGNOSIS — Z1231 Encounter for screening mammogram for malignant neoplasm of breast: Secondary | ICD-10-CM | POA: Diagnosis present

## 2020-01-06 ENCOUNTER — Encounter: Payer: Commercial Managed Care - PPO | Admitting: Family Medicine

## 2020-01-30 ENCOUNTER — Encounter: Payer: Self-pay | Admitting: Family Medicine

## 2020-01-30 ENCOUNTER — Other Ambulatory Visit: Payer: Self-pay

## 2020-01-30 ENCOUNTER — Ambulatory Visit (INDEPENDENT_AMBULATORY_CARE_PROVIDER_SITE_OTHER): Payer: Commercial Managed Care - PPO | Admitting: Family Medicine

## 2020-01-30 VITALS — BP 120/80 | HR 76 | Temp 97.1°F | Ht 65.0 in | Wt 156.8 lb

## 2020-01-30 DIAGNOSIS — Z23 Encounter for immunization: Secondary | ICD-10-CM | POA: Diagnosis not present

## 2020-01-30 DIAGNOSIS — E559 Vitamin D deficiency, unspecified: Secondary | ICD-10-CM | POA: Diagnosis not present

## 2020-01-30 DIAGNOSIS — I10 Essential (primary) hypertension: Secondary | ICD-10-CM | POA: Diagnosis not present

## 2020-01-30 DIAGNOSIS — R350 Frequency of micturition: Secondary | ICD-10-CM

## 2020-01-30 DIAGNOSIS — R809 Proteinuria, unspecified: Secondary | ICD-10-CM

## 2020-01-30 DIAGNOSIS — Z0000 Encounter for general adult medical examination without abnormal findings: Secondary | ICD-10-CM | POA: Diagnosis not present

## 2020-01-30 LAB — HEPATIC FUNCTION PANEL
ALT: 12 U/L (ref 0–35)
AST: 17 U/L (ref 0–37)
Albumin: 4.2 g/dL (ref 3.5–5.2)
Alkaline Phosphatase: 59 U/L (ref 39–117)
Bilirubin, Direct: 0.1 mg/dL (ref 0.0–0.3)
Total Bilirubin: 0.8 mg/dL (ref 0.2–1.2)
Total Protein: 7.2 g/dL (ref 6.0–8.3)

## 2020-01-30 LAB — POCT URINALYSIS DIPSTICK OB
Bilirubin, UA: NEGATIVE
Blood, UA: NEGATIVE
Glucose, UA: NEGATIVE
Ketones, UA: NEGATIVE
Leukocytes, UA: NEGATIVE
Nitrite, UA: NEGATIVE
Spec Grav, UA: 1.025 (ref 1.010–1.025)
Urobilinogen, UA: 0.2 E.U./dL
pH, UA: 6 (ref 5.0–8.0)

## 2020-01-30 LAB — BASIC METABOLIC PANEL
BUN: 14 mg/dL (ref 6–23)
CO2: 29 mEq/L (ref 19–32)
Calcium: 9.2 mg/dL (ref 8.4–10.5)
Chloride: 105 mEq/L (ref 96–112)
Creatinine, Ser: 0.82 mg/dL (ref 0.40–1.20)
GFR: 76.42 mL/min (ref >60.00)
Glucose, Bld: 91 mg/dL (ref 70–99)
Potassium: 3.7 mEq/L (ref 3.5–5.1)
Sodium: 141 mEq/L (ref 135–145)

## 2020-01-30 LAB — CBC WITH DIFFERENTIAL/PLATELET
Basophils Absolute: 0 10*3/uL (ref 0.0–0.1)
Basophils Relative: 1.5 % (ref 0.0–3.0)
Eosinophils Absolute: 0.1 10*3/uL (ref 0.0–0.7)
Eosinophils Relative: 2.1 % (ref 0.0–5.0)
HCT: 39.9 % (ref 36.0–46.0)
Hemoglobin: 13.2 g/dL (ref 12.0–15.0)
Lymphocytes Relative: 45.6 % (ref 12.0–46.0)
Lymphs Abs: 1.1 10*3/uL (ref 0.7–4.0)
MCHC: 33.2 g/dL (ref 30.0–36.0)
MCV: 98.6 fl (ref 78.0–100.0)
Monocytes Absolute: 0.2 10*3/uL (ref 0.1–1.0)
Monocytes Relative: 8 % (ref 3.0–12.0)
Neutro Abs: 1.1 10*3/uL — ABNORMAL LOW (ref 1.4–7.7)
Neutrophils Relative %: 42.8 % — ABNORMAL LOW (ref 43.0–77.0)
Platelets: 190 10*3/uL (ref 150.0–400.0)
RBC: 4.04 Mil/uL (ref 3.87–5.11)
RDW: 13.6 % (ref 11.5–15.5)
WBC: 2.5 10*3/uL — ABNORMAL LOW (ref 4.0–10.5)

## 2020-01-30 LAB — LIPID PANEL
Cholesterol: 194 mg/dL (ref 0–200)
HDL: 62.7 mg/dL (ref >39.00)
LDL Cholesterol: 117 mg/dL — ABNORMAL HIGH (ref 0–99)
NonHDL: 131.29
Total CHOL/HDL Ratio: 3
Triglycerides: 72 mg/dL (ref 0.0–149.0)
VLDL: 14.4 mg/dL (ref 0.0–40.0)

## 2020-01-30 LAB — TSH: TSH: 1.14 u[IU]/mL (ref 0.35–4.50)

## 2020-01-30 LAB — VITAMIN D 25 HYDROXY (VIT D DEFICIENCY, FRACTURES): VITD: 37.97 ng/mL (ref 30.00–100.00)

## 2020-01-30 NOTE — Assessment & Plan Note (Signed)
Check labs and replete prn. 

## 2020-01-30 NOTE — Progress Notes (Signed)
   Subjective:    Patient ID: Brandy May, female    DOB: October 22, 1955, 65 y.o.   MRN: [id number]  HPI CPE- UTD on pap, mammo, colonoscopy, COVID (including booster), Tdap.  Will get flu shot today.  Reviewed past medical, surgical, family and social histories.   Patient Care Team    Relationship Specialty Notifications Start End  Sheliah Hatch, MD PCP - General Family Medicine  12/23/10   Lauralyn Primes., MD Consulting Physician Dentistry  10/11/14   Malen Gauze, MD Consulting Physician Urology  10/11/14   Marzella Schlein., MD Consulting Physician Ophthalmology  10/11/14   Maxie Better, MD Consulting Physician Obstetrics and Gynecology  10/11/14   Sharrell Ku, MD Consulting Physician Gastroenterology  10/11/14     Health Maintenance  Topic Date Due  . INFLUENZA VACCINE  08/28/2019  . HIV Screening  01/29/2021 (Originally 03/18/1972)  . MAMMOGRAM  12/18/2020  . PAP SMEAR-Modifier  01/04/2021  . COLONOSCOPY (Pts 45-30yrs Insurance coverage will need to be confirmed)  06/26/2027  . TETANUS/TDAP  06/08/2029  . COVID-19 Vaccine  Completed  . Hepatitis C Screening  Completed      Review of Systems Patient reports no vision/ hearing changes, adenopathy,fever, weight change,  persistant/recurrent hoarseness , swallowing issues, chest pain, palpitations, edema, persistant/recurrent cough, hemoptysis, dyspnea (rest/exertional/paroxysmal nocturnal), gastrointestinal bleeding (melena, rectal bleeding), abdominal pain, significant heartburn, bowel changes, GU symptoms (dysuria, hematuria, incontinence), Gyn symptoms (abnormal  bleeding, pain),  syncope, focal weakness, memory loss, numbness & tingling, skin/hair/nail changes, abnormal bruising or bleeding, anxiety, or depression.   This visit occurred during the SARS-CoV-2 public health emergency.  Safety protocols were in place, including screening questions prior to the visit, additional usage of staff PPE, and  extensive cleaning of exam room while observing appropriate contact time as indicated for disinfecting solutions.       Objective:   Physical Exam General Appearance:    Alert, cooperative, no distress, appears stated age  Head:    Normocephalic, without obvious abnormality, atraumatic  Eyes:    PERRL, conjunctiva/corneas clear, EOM's intact, fundi    benign, both eyes  Ears:    Normal TM's and external ear canals, both ears  Nose:   Deferred due to COVID  Throat:   Neck:   Supple, symmetrical, trachea midline, no adenopathy;    Thyroid: no enlargement/tenderness/nodules  Back:     Symmetric, no curvature, ROM normal, no CVA tenderness  Lungs:     Clear to auscultation bilaterally, respirations unlabored  Chest Wall:    No tenderness or deformity   Heart:    Regular rate and rhythm, S1 and S2 normal, no murmur, rub   or gallop  Breast Exam:    Deferred to GYN  Abdomen:     Soft, non-tender, bowel sounds active all four quadrants,    no masses, no organomegaly  Genitalia:    Deferred to GYN  Rectal:    Extremities:   Extremities normal, atraumatic, no cyanosis or edema  Pulses:   2+ and symmetric all extremities  Skin:   Skin color, texture, turgor normal, no rashes or lesions  Lymph nodes:   Cervical, supraclavicular, and axillary nodes normal  Neurologic:   CNII-XII intact, normal strength, sensation and reflexes    throughout          Assessment & Plan:

## 2020-01-30 NOTE — Assessment & Plan Note (Signed)
Pt's PE.  UTD on pap, mammo, colonoscopy.  UTD on Tdap, COVID.  Flu shot given today.

## 2020-01-30 NOTE — Patient Instructions (Addendum)
Follow up in 6 months to recheck BP and cholesterol °We'll notify you of your lab results and make any changes if needed °Continue to work on healthy diet and regular exercise- you look great!!! °Call with any questions or concerns °Stay Safe!  Stay Healthy! °Happy New Year!! °

## 2020-01-30 NOTE — Assessment & Plan Note (Signed)
Chronic problem.  Well controlled today.  Currently asymptomatic.  Check labs.  No anticipated med changes. °

## 2020-01-31 LAB — URINE CULTURE
MICRO NUMBER:: 11376278
Result:: NO GROWTH
SPECIMEN QUALITY:: ADEQUATE

## 2020-07-25 ENCOUNTER — Encounter: Payer: Self-pay | Admitting: *Deleted

## 2020-08-03 ENCOUNTER — Ambulatory Visit: Payer: Commercial Managed Care - PPO | Admitting: Family Medicine

## 2020-10-23 ENCOUNTER — Encounter: Payer: Self-pay | Admitting: Family Medicine

## 2020-10-23 ENCOUNTER — Other Ambulatory Visit: Payer: Self-pay

## 2020-10-23 ENCOUNTER — Ambulatory Visit (INDEPENDENT_AMBULATORY_CARE_PROVIDER_SITE_OTHER): Payer: Commercial Managed Care - PPO | Admitting: Family Medicine

## 2020-10-23 VITALS — BP 120/74 | HR 78 | Temp 97.6°F | Ht 65.0 in | Wt 156.0 lb

## 2020-10-23 DIAGNOSIS — E663 Overweight: Secondary | ICD-10-CM

## 2020-10-23 DIAGNOSIS — Z23 Encounter for immunization: Secondary | ICD-10-CM

## 2020-10-23 DIAGNOSIS — E785 Hyperlipidemia, unspecified: Secondary | ICD-10-CM | POA: Diagnosis not present

## 2020-10-23 DIAGNOSIS — I10 Essential (primary) hypertension: Secondary | ICD-10-CM

## 2020-10-23 LAB — CBC WITH DIFFERENTIAL/PLATELET
Basophils Absolute: 0 10*3/uL (ref 0.0–0.1)
Basophils Relative: 0.6 % (ref 0.0–3.0)
Eosinophils Absolute: 0 10*3/uL (ref 0.0–0.7)
Eosinophils Relative: 1.3 % (ref 0.0–5.0)
HCT: 40.6 % (ref 36.0–46.0)
Hemoglobin: 13.2 g/dL (ref 12.0–15.0)
Lymphocytes Relative: 43.2 % (ref 12.0–46.0)
Lymphs Abs: 1.3 10*3/uL (ref 0.7–4.0)
MCHC: 32.5 g/dL (ref 30.0–36.0)
MCV: 101.3 fl — ABNORMAL HIGH (ref 78.0–100.0)
Monocytes Absolute: 0.2 10*3/uL (ref 0.1–1.0)
Monocytes Relative: 7.6 % (ref 3.0–12.0)
Neutro Abs: 1.4 10*3/uL (ref 1.4–7.7)
Neutrophils Relative %: 47.3 % (ref 43.0–77.0)
Platelets: 166 10*3/uL (ref 150.0–400.0)
RBC: 4.01 Mil/uL (ref 3.87–5.11)
RDW: 13.1 % (ref 11.5–15.5)
WBC: 3 10*3/uL — ABNORMAL LOW (ref 4.0–10.5)

## 2020-10-23 LAB — HEPATIC FUNCTION PANEL
ALT: 13 U/L (ref 0–35)
AST: 18 U/L (ref 0–37)
Albumin: 4.1 g/dL (ref 3.5–5.2)
Alkaline Phosphatase: 60 U/L (ref 39–117)
Bilirubin, Direct: 0.1 mg/dL (ref 0.0–0.3)
Total Bilirubin: 0.6 mg/dL (ref 0.2–1.2)
Total Protein: 7.3 g/dL (ref 6.0–8.3)

## 2020-10-23 LAB — BASIC METABOLIC PANEL
BUN: 14 mg/dL (ref 6–23)
CO2: 29 mEq/L (ref 19–32)
Calcium: 9.4 mg/dL (ref 8.4–10.5)
Chloride: 103 mEq/L (ref 96–112)
Creatinine, Ser: 0.76 mg/dL (ref 0.40–1.20)
GFR: 83.29 mL/min (ref >60.00)
Glucose, Bld: 79 mg/dL (ref 70–99)
Potassium: 3.8 mEq/L (ref 3.5–5.1)
Sodium: 139 mEq/L (ref 135–145)

## 2020-10-23 LAB — LIPID PANEL
Cholesterol: 186 mg/dL (ref 0–200)
HDL: 60.5 mg/dL (ref >39.00)
LDL Cholesterol: 110 mg/dL — ABNORMAL HIGH (ref 0–99)
NonHDL: 125.06
Total CHOL/HDL Ratio: 3
Triglycerides: 74 mg/dL (ref 0.0–149.0)
VLDL: 14.8 mg/dL (ref 0.0–40.0)

## 2020-10-23 LAB — TSH: TSH: 1.12 u[IU]/mL (ref 0.35–5.50)

## 2020-10-23 NOTE — Assessment & Plan Note (Signed)
Pt has gained 7 lbs since last visit.  BMI is now 25.96  Encouraged healthy diet and regular exercise.  Will follow.

## 2020-10-23 NOTE — Assessment & Plan Note (Signed)
Chronic problem.  Tolerating Crestor w/o difficulty.  Check labs.  Adjust meds prn  

## 2020-10-23 NOTE — Progress Notes (Signed)
   Subjective:    Patient ID: Brandy May, female    DOB: 22-Oct-1955, 65 y.o.   MRN: [id number]  HPI HTN- chronic problem, on Amlodipine 5mg  daily w/ good control.  No CP, SOB, HAs, visual changes, edema.  Some fatigue- 'i've been doing a lot'.  Hyperlipidemia- chronic problem.  On Crestor 10mg  daily.  No abd pain, N/V.  Overweight- pt has gained 7 lbs since last visit.  BMI now 25.96  has recently increased her walking to 4-5x/week   Review of Systems For ROS see HPI   This visit occurred during the SARS-CoV-2 public health emergency.  Safety protocols were in place, including screening questions prior to the visit, additional usage of staff PPE, and extensive cleaning of exam room while observing appropriate contact time as indicated for disinfecting solutions.      Objective:   Physical Exam Vitals reviewed.  Constitutional:      General: She is not in acute distress.    Appearance: Normal appearance. She is well-developed. She is not ill-appearing or diaphoretic.  HENT:     Head: Normocephalic and atraumatic.  Eyes:     Conjunctiva/sclera: Conjunctivae normal.     Pupils: Pupils are equal, round, and reactive to light.  Neck:     Thyroid: No thyromegaly.  Cardiovascular:     Rate and Rhythm: Normal rate and regular rhythm.     Heart sounds: Normal heart sounds. No murmur heard. Pulmonary:     Effort: Pulmonary effort is normal. No respiratory distress.     Breath sounds: Normal breath sounds.  Abdominal:     General: There is no distension.     Palpations: Abdomen is soft.     Tenderness: There is no abdominal tenderness.  Musculoskeletal:     Cervical back: Normal range of motion and neck supple.     Right lower leg: No edema.     Left lower leg: No edema.  Lymphadenopathy:     Cervical: No cervical adenopathy.  Skin:    General: Skin is warm and dry.  Neurological:     General: No focal deficit present.     Mental Status: She is alert and oriented to  person, place, and time.  Psychiatric:        Mood and Affect: Mood normal.        Behavior: Behavior normal.        Thought Content: Thought content normal.          Assessment & Plan:

## 2020-10-23 NOTE — Assessment & Plan Note (Signed)
Chronic problem.  Currently well controlled on Amlodipine.  Asymptomatic.  No med changes at this time

## 2020-10-23 NOTE — Patient Instructions (Signed)
Schedule your complete physical in 6 months We'll notify you of your lab results and make any changes if needed Continue to work on healthy diet and regular exercise- you're doing great!!! Call with any questions or concerns Stay Safe!  Stay Healthy! Happy Fall!!! 

## 2020-10-24 ENCOUNTER — Other Ambulatory Visit: Payer: Self-pay

## 2020-10-24 DIAGNOSIS — R0683 Snoring: Secondary | ICD-10-CM

## 2020-10-29 ENCOUNTER — Other Ambulatory Visit: Payer: Self-pay | Admitting: Family Medicine

## 2020-11-05 ENCOUNTER — Ambulatory Visit (INDEPENDENT_AMBULATORY_CARE_PROVIDER_SITE_OTHER): Payer: Commercial Managed Care - PPO | Admitting: Pulmonary Disease

## 2020-11-05 ENCOUNTER — Other Ambulatory Visit: Payer: Self-pay

## 2020-11-05 ENCOUNTER — Encounter: Payer: Self-pay | Admitting: Pulmonary Disease

## 2020-11-05 VITALS — BP 132/68 | HR 70 | Temp 98.1°F | Ht 65.0 in | Wt 156.4 lb

## 2020-11-05 DIAGNOSIS — R0683 Snoring: Secondary | ICD-10-CM | POA: Diagnosis not present

## 2020-11-05 NOTE — Patient Instructions (Signed)
Will arrange home for sleep study Will call to arrange for follow up after sleep study reviewed

## 2020-11-05 NOTE — Progress Notes (Signed)
[facility]  Chief Complaint  Patient presents with   Consult    Sleep Consult-    Past Surgical History:  She  has a past surgical history that includes Tubal ligation (1994); Cesarean section ([place]); Knee arthroscopy (Right, 07-19-2010); RIGHT THUMB TRIGGER RELEASE (2014); Cystoscopy with ureteroscopy and stent placement (Right, 08/16/2014); Cystoscopy/retrograde/ureteroscopy/stone extraction with basket (Right, 08/30/2014); Cystoscopy w/ ureteral stent placement (Right, 08/30/2014); and Cystoscopy/retrograde/ureteroscopy (Right, 06/09/2016).  Past Medical History:  DVT 1991, Nephrolithiasis, HLD, PUD, HTN  Constitutional:  BP 132/68 (BP Location: Left Arm, Patient Position: Sitting, Cuff Size: Normal)   Pulse 70   Temp 98.1 F (36.7 C) (Oral)   Ht 5\' 5"  (1.651 m)   Wt 156 lb 6.4 oz (70.9 kg)   LMP 01/13/2012   SpO2 99%   BMI 26.03 kg/m   Brief Summary:  Brandy May is a 65 y.o. female with snoring.      Subjective:   She was recently communicating with her PCP.  She was told last time she had surgery that she should consider getting a sleep study done.  Her husband has told her that she snores.  She wakes up feeling choked sometimes.  She feels tired during the day.  Her husband recently had a home sleep study and is in the process of getting a CPAP machine.  She goes to sleep between 10 and 11 pm.  She falls asleep in 15 minutes.  She wakes up 3 or 4 times to use the bathroom.  She gets out of bed at 5 am.  She feels tired in the morning.  She denies morning headache.  She does not use anything to help her fall sleep or stay awake.  She denies sleep walking, sleep talking, bruxism, or nightmares.  There is no history of restless legs.  She denies sleep hallucinations, sleep paralysis, or cataplexy.  The Epworth score is 10 out of 24.   Physical Exam:   Appearance - well kempt   ENMT - no sinus tenderness, no  oral exudate, no LAN, Mallampati 3 airway, no stridor  Respiratory - equal breath sounds bilaterally, no wheezing or rales  CV - s1s2 regular rate and rhythm, no murmurs  Ext - no clubbing, no edema  Skin - no rashes  Psych - normal mood and affect   Sleep Tests:     Social History:  She  reports that she has never smoked. She has never used smokeless tobacco. She reports that she does not drink alcohol and does not use drugs.  Family History:  Her family history includes Arthritis in her mother; Cancer in her father; Hyperlipidemia in her mother and sister; Hypertension in her mother.    Discussion:  She has snoring, sleep disruption, apnea, and daytime sleepiness.  She has history of hypertension.  I am concerned she could have obstructive sleep apnea.  Assessment/Plan:   Snoring with excessive daytime sleepiness. - will need to arrange for a home sleep study  Obesity. - discussed how weight can impact sleep and risk for sleep disordered breathing - discussed options to assist with weight loss: combination of diet modification, cardiovascular and strength training exercises  Cardiovascular risk. - had an extensive discussion regarding the adverse health consequences related to untreated sleep disordered breathing - specifically discussed the risks for hypertension, coronary artery disease, cardiac dysrhythmias, cerebrovascular disease, and diabetes - lifestyle modification discussed  Safe driving practices. - discussed how sleep disruption can  increase risk of accidents, particularly when driving - safe driving practices were discussed  Therapies for obstructive sleep apnea. - if the sleep study shows significant sleep apnea, then various therapies for treatment were reviewed: CPAP, oral appliance, and surgical interventions  Time Spent Involved in Patient Care on Day of Examination:  33 minutes  Follow up:   Patient Instructions  Will arrange home for sleep  study Will call to arrange for follow up after sleep study reviewed  Medication List:   Allergies as of 11/05/2020       Reactions   Kiwi Extract Swelling   Throat   Coumadin [warfarin Sodium] Itching   Severe Itching        Medication List        Accurate as of November 05, 2020 10:57 AM. If you have any questions, ask your nurse or doctor.          amLODipine 5 MG tablet Commonly known as: NORVASC TAKE 1 TABLET DAILY   Osphena 60 MG Tabs Generic drug: Ospemifene Take 1 tablet by mouth daily.   rosuvastatin 10 MG tablet Commonly known as: CRESTOR TAKE 1 TABLET DAILY   tamsulosin 0.4 MG Caps capsule Commonly known as: FLOMAX Take 0.4 mg by mouth.        Signature:  Chesley Mires, MD [place] Pager - [phone] 11/05/2020, 10:57 AM

## 2020-12-06 ENCOUNTER — Other Ambulatory Visit (HOSPITAL_BASED_OUTPATIENT_CLINIC_OR_DEPARTMENT_OTHER): Payer: Self-pay | Admitting: Family Medicine

## 2020-12-06 DIAGNOSIS — Z1231 Encounter for screening mammogram for malignant neoplasm of breast: Secondary | ICD-10-CM

## 2021-01-08 ENCOUNTER — Ambulatory Visit (HOSPITAL_BASED_OUTPATIENT_CLINIC_OR_DEPARTMENT_OTHER)
Admission: RE | Admit: 2021-01-08 | Discharge: 2021-01-08 | Disposition: A | Discharge status: Home / Self Care | Payer: Commercial Managed Care - PPO | Source: Ambulatory Visit | Attending: Family Medicine | Admitting: Family Medicine

## 2021-01-08 ENCOUNTER — Other Ambulatory Visit: Payer: Self-pay

## 2021-01-08 ENCOUNTER — Encounter (HOSPITAL_BASED_OUTPATIENT_CLINIC_OR_DEPARTMENT_OTHER): Payer: Self-pay

## 2021-01-08 DIAGNOSIS — Z1231 Encounter for screening mammogram for malignant neoplasm of breast: Secondary | ICD-10-CM | POA: Insufficient documentation

## 2021-01-09 ENCOUNTER — Other Ambulatory Visit: Payer: Self-pay | Admitting: Family Medicine

## 2021-01-09 DIAGNOSIS — R928 Other abnormal and inconclusive findings on diagnostic imaging of breast: Secondary | ICD-10-CM

## 2021-01-10 LAB — HM PAP SMEAR: HM Pap smear: NEGATIVE

## 2021-01-14 ENCOUNTER — Ambulatory Visit (HOSPITAL_BASED_OUTPATIENT_CLINIC_OR_DEPARTMENT_OTHER): Payer: Commercial Managed Care - PPO

## 2021-01-31 ENCOUNTER — Ambulatory Visit (INDEPENDENT_AMBULATORY_CARE_PROVIDER_SITE_OTHER): Payer: Commercial Managed Care - PPO

## 2021-01-31 ENCOUNTER — Other Ambulatory Visit: Payer: Self-pay

## 2021-01-31 DIAGNOSIS — G4733 Obstructive sleep apnea (adult) (pediatric): Secondary | ICD-10-CM

## 2021-01-31 DIAGNOSIS — R0683 Snoring: Secondary | ICD-10-CM

## 2021-02-01 ENCOUNTER — Telehealth: Payer: Self-pay | Admitting: Pulmonary Disease

## 2021-02-01 DIAGNOSIS — G4733 Obstructive sleep apnea (adult) (pediatric): Secondary | ICD-10-CM

## 2021-02-01 NOTE — Telephone Encounter (Signed)
Called and spoke to patient and relayed HST results. She voiced understanding. Scheduled her an appt for next wed. 02/06/2021 with Geraldo Pitter NP. Nothing further needed.

## 2021-02-01 NOTE — Telephone Encounter (Signed)
HST 01/31/21 >> AHI 21.6, SpO2 low 74%   Please inform her that her sleep study shows moderate obstructive sleep apnea.  Please arrange for ROV with me or NP to discuss treatment options.

## 2021-02-06 ENCOUNTER — Other Ambulatory Visit: Payer: Self-pay

## 2021-02-06 ENCOUNTER — Ambulatory Visit (INDEPENDENT_AMBULATORY_CARE_PROVIDER_SITE_OTHER): Payer: Commercial Managed Care - PPO | Admitting: Primary Care

## 2021-02-06 ENCOUNTER — Encounter: Payer: Self-pay | Admitting: Primary Care

## 2021-02-06 VITALS — BP 134/78 | HR 88 | Temp 98.1°F | Ht 65.0 in | Wt 163.2 lb

## 2021-02-06 DIAGNOSIS — G4733 Obstructive sleep apnea (adult) (pediatric): Secondary | ICD-10-CM

## 2021-02-06 NOTE — Progress Notes (Signed)
@Patient  ID: Brandy May, female    DOB: 1956-01-12, 66 y.o.   MRN: [id number]  Chief Complaint  Patient presents with   Follow-up    Patient has no complaints.     Referring provider: Midge Minium, MD  HPI: 66 year old female. Never smoked. PMH significant for HTN, thyromegaly. Patient of Dr. Halford Chessman, seen for sleep consult on 11/05/20 for snoring.   Previous [facility] encounter: 11/05/20 She was recently communicating with her PCP.  She was told last time she had surgery that she should consider getting a sleep study done.  Her husband has told her that she snores.  She wakes up feeling choked sometimes.  She feels tired during the day.  Her husband recently had a home sleep study and is in the process of getting a CPAP machine.  She goes to sleep between 10 and 11 pm.  She falls asleep in 15 minutes.  She wakes up 3 or 4 times to use the bathroom.  She gets out of bed at 5 am.  She feels tired in the morning.  She denies morning headache.  She does not use anything to help her fall sleep or stay awake.  She denies sleep walking, sleep talking, bruxism, or nightmares.  There is no history of restless legs.  She denies sleep hallucinations, sleep paralysis, or cataplexy.  The Epworth score is 10 out of 24.   02/06/2021- Interim hx  Patient presents today to review sleep study results. Hst 01/31/21 showed moderate OSA, AHI 21.6/hr with SpO2 low 75% (average 93%). We review sleep study results, discussed risk fo untreated sleep apnea and treatment options. She would like to hold off on starting CPAP therapy and would like to see ENT for possibly surgical options and/or orthodontics for oral appliance.    Allergies  Allergen Reactions   Kiwi Extract Swelling    Throat   Coumadin [Warfarin Sodium] Itching    Severe Itching     Immunization History  Administered Date(s) Administered   Influenza,inj,Quad PF,6+ Mos 11/07/2013, 10/12/2014, 10/19/2015, 10/24/2016,  11/20/2017, 12/09/2018, 01/30/2020, 10/23/2020   PFIZER(Purple Top)SARS-COV-2 Vaccination 04/06/2019, 04/27/2019, 12/29/2019   Tdap 01/27/2009, 06/09/2019   Zoster Recombinat (Shingrix) 10/23/2020    Past Medical History:  Diagnosis Date   History of DVT of lower extremity 1991   w/ second pregancy   History of kidney stones 2016   History of peptic ulcer 1998   Hyperlipidemia    Hypertension    Wears glasses     Tobacco History: Social History   Tobacco Use  Smoking Status Never  Smokeless Tobacco Never   Counseling given: Not Answered   Outpatient Medications Prior to Visit  Medication Sig Dispense Refill   amLODipine (NORVASC) 5 MG tablet TAKE 1 TABLET DAILY 90 tablet 3   OSPHENA 60 MG TABS Take 1 tablet by mouth daily.     rosuvastatin (CRESTOR) 10 MG tablet TAKE 1 TABLET DAILY 90 tablet 3   tamsulosin (FLOMAX) 0.4 MG CAPS capsule Take 0.4 mg by mouth.     No facility-administered medications prior to visit.    Review of Systems  Review of Systems  Constitutional:  Positive for fatigue.  HENT:  Positive for congestion.   Respiratory: Negative.    Cardiovascular: Negative.     Physical Exam  BP 134/78 (BP Location: Left Arm, Patient Position: Sitting, Cuff Size: Normal)    Pulse 88    Temp 98.1 F (36.7 C) (Oral)    Ht  5\' 5"  (1.651 m)    Wt 163 lb 3.2 oz (74 kg)    LMP 01/13/2012    SpO2 99%    BMI 27.16 kg/m  Physical Exam Constitutional:      Appearance: Normal appearance.  Cardiovascular:     Rate and Rhythm: Normal rate and regular rhythm.  Neurological:     General: No focal deficit present.     Mental Status: She is alert and oriented to person, place, and time.  Psychiatric:        Mood and Affect: Mood normal.        Behavior: Behavior normal.        Thought Content: Thought content normal.        Judgment: Judgment normal.     Lab Results:  CBC    Component Value Date/Time   WBC 3.0 (L) 10/23/2020 1345   RBC 4.01 10/23/2020 1345    HGB 13.2 10/23/2020 1345   HGB 13.1 11/29/2012 1526   HCT 40.6 10/23/2020 1345   HCT 40.6 11/29/2012 1526   PLT 166.0 10/23/2020 1345   PLT 197 11/29/2012 1526   MCV 101.3 (H) 10/23/2020 1345   MCV 101 11/29/2012 1526   MCH 32.8 08/02/2014 0149   MCHC 32.5 10/23/2020 1345   RDW 13.1 10/23/2020 1345   RDW 11.5 11/29/2012 1526   LYMPHSABS 1.3 10/23/2020 1345   LYMPHSABS 1.1 11/29/2012 1526   MONOABS 0.2 10/23/2020 1345   EOSABS 0.0 10/23/2020 1345   EOSABS 0.1 11/29/2012 1526   BASOSABS 0.0 10/23/2020 1345   BASOSABS 0.0 11/29/2012 1526    BMET    Component Value Date/Time   NA 139 10/23/2020 1345   K 3.8 10/23/2020 1345   CL 103 10/23/2020 1345   CO2 29 10/23/2020 1345   GLUCOSE 79 10/23/2020 1345   BUN 14 10/23/2020 1345   CREATININE 0.76 10/23/2020 1345   CALCIUM 9.4 10/23/2020 1345   GFRNONAA >60 08/02/2014 0149   GFRAA >60 08/02/2014 0149    BNP No results found for: BNP  ProBNP No results found for: PROBNP  Imaging: MM 3D SCREEN BREAST BILATERAL  Result Date: 01/09/2021 CLINICAL DATA:  Screening. EXAM: DIGITAL SCREENING BILATERAL MAMMOGRAM WITH TOMOSYNTHESIS AND CAD TECHNIQUE: Bilateral screening digital craniocaudal and mediolateral oblique mammograms were obtained. Bilateral screening digital breast tomosynthesis was performed. The images were evaluated with computer-aided detection. COMPARISON:  Previous exam(s). ACR Breast Density Category c: The breast tissue is heterogeneously dense, which may obscure small masses. FINDINGS: In the left breast, a possible asymmetry warrants further evaluation. In the right breast, no findings suspicious for malignancy. IMPRESSION: Further evaluation is suggested for possible asymmetry in the left breast. RECOMMENDATION: Diagnostic mammogram and possibly ultrasound of the left breast. (Code:[id number]) The patient will be contacted regarding the findings, and additional imaging will be scheduled. BI-RADS CATEGORY  0: Incomplete.  Need additional imaging evaluation and/or prior mammograms for comparison. Electronically Signed   By: Abelardo Diesel M.D.   On: 01/09/2021 00:25     Assessment & Plan:   Moderate obstructive sleep apnea - Patient has symptoms of snoring, morning fatigue/headaches. Home sleep test 01/31/21 showed moderate OSA, AHI 21.6/hr with SpO2 low 75% (average 93%). We discussed risk of untreated sleep apnea and treatment options. She would like to hold off on CPAP therapy. Recommend she be evaluated by ENT for possible surgical options for treatment of her moderate OSA, if no surgical  intervention recommended or patient does not wish to proceed with procedure, advised  she contact Dr. Toy Cookey with orthodontics for consideration oral appliance. Encourage side sleeping position, advised against driving if experiencing excessive daytime sleepiness. Follow-up in 6 months or sooner if needed.    Martyn Ehrich, NP 02/07/2021

## 2021-02-06 NOTE — Patient Instructions (Addendum)
Sleep study showed moderate obstructive sleep apnea, you had on average 21 apneic/hypopneic events an hour  Recommend seeing ENT first for possible surgical options, if no surgical options or you elect not to proceed with surgery please contact Dr. Toy Cookey for oral appliance consideration. Dr. Kerin Perna number is [phone]. If you do not wish to pursue surgical options or oral appliance recommend returning to see [place] to discuss CPAP treatment   Referral: ENT re: OSA  Orthodontics re: OSA (patient will call for appointment if needed)  Follow-up: 6 months with Dr. Halford Chessman or sooner if needed      Sleep Apnea Sleep apnea is a condition in which breathing pauses or becomes shallow during sleep. People with sleep apnea usually snore loudly. They may have times when they gasp and stop breathing for 10 seconds or more during sleep. This may happen many times during the night. Sleep apnea disrupts your sleep and keeps your body from getting the rest that it needs. This condition can increase your risk of certain health problems, including: Heart attack. Stroke. Obesity. Type 2 diabetes. Heart failure. Irregular heartbeat. High blood pressure. The goal of treatment is to help you breathe normally again. What are the causes? The most common cause of sleep apnea is a collapsed or blocked airway. There are three kinds of sleep apnea: Obstructive sleep apnea. This kind is caused by a blocked or collapsed airway. Central sleep apnea. This kind happens when the part of the brain that controls breathing does not send the correct signals to the muscles that control breathing. Mixed sleep apnea. This is a combination of obstructive and central sleep apnea. What increases the risk? You are more likely to develop this condition if you: Are overweight. Smoke. Have a smaller than normal airway. Are older. Are female. Drink alcohol. Take sedatives or tranquilizers. Have a family history of sleep  apnea. Have a tongue or tonsils that are larger than normal. What are the signs or symptoms? Symptoms of this condition include: Trouble staying asleep. Loud snoring. Morning headaches. Waking up gasping. Dry mouth or sore throat in the morning. Daytime sleepiness and tiredness. If you have daytime fatigue because of sleep apnea, you may be more likely to have: Trouble concentrating. Forgetfulness. Irritability or mood swings. Personality changes. Feelings of depression. Sexual dysfunction. This may include loss of interest if you are female, or erectile dysfunction if you are female. How is this diagnosed? This condition may be diagnosed with: A medical history. A physical exam. A series of tests that are done while you are sleeping (sleep study). These tests are usually done in a sleep lab, but they may also be done at home. How is this treated? Treatment for this condition aims to restore normal breathing and to ease symptoms during sleep. It may involve managing health issues that can affect breathing, such as high blood pressure or obesity. Treatment may include: Sleeping on your side. Using a decongestant if you have nasal congestion. Avoiding the use of depressants, including alcohol, sedatives, and narcotics. Losing weight if you are overweight. Making changes to your diet. Quitting smoking. Using a device to open your airway while you sleep, such as: An oral appliance. This is a custom-made mouthpiece that shifts your lower jaw forward. A continuous positive airway pressure (CPAP) device. This device blows air through a mask when you breathe out (exhale). A nasal expiratory positive airway pressure (EPAP) device. This device has valves that you put into each nostril. A bi-level positive  airway pressure (BIPAP) device. This device blows air through a mask when you breathe in (inhale) and breathe out (exhale). Having surgery if other treatments do not work. During surgery,  excess tissue is removed to create a wider airway. Follow these instructions at home: Lifestyle Make any lifestyle changes that your health care provider recommends. Eat a healthy, well-balanced diet. Take steps to lose weight if you are overweight. Avoid using depressants, including alcohol, sedatives, and narcotics. Do not use any products that contain nicotine or tobacco. These products include cigarettes, chewing tobacco, and vaping devices, such as e-cigarettes. If you need help quitting, ask your health care provider. General instructions Take over-the-counter and prescription medicines only as told by your health care provider. If you were given a device to open your airway while you sleep, use it only as told by your health care provider. If you are having surgery, make sure to tell your health care provider you have sleep apnea. You may need to bring your device with you. Keep all follow-up visits. This is important. Contact a health care provider if: The device that you received to open your airway during sleep is uncomfortable or does not seem to be working. Your symptoms do not improve. Your symptoms get worse. Get help right away if: You develop: Chest pain. Shortness of breath. Discomfort in your back, arms, or stomach. You have: Trouble speaking. Weakness on one side of your body. Drooping in your face. These symptoms may represent a serious problem that is an emergency. Do not wait to see if the symptoms will go away. Get medical help right away. Call your local emergency services (911 in the [place] Do not drive yourself to the hospital. Summary Sleep apnea is a condition in which breathing pauses or becomes shallow during sleep. The most common cause is a collapsed or blocked airway. The goal of treatment is to restore normal breathing and to ease symptoms during sleep. This information is not intended to replace advice given to you by your health care provider. Make  sure you discuss any questions you have with your health care provider. Document Revised: 08/22/2020 Document Reviewed: 12/23/2019 Elsevier Patient Education  2022 [employer].

## 2021-02-07 DIAGNOSIS — G4733 Obstructive sleep apnea (adult) (pediatric): Secondary | ICD-10-CM | POA: Insufficient documentation

## 2021-02-07 NOTE — Progress Notes (Signed)
Reviewed and agree with assessment/plan.   Chesley Mires, MD [facility] 02/07/2021, 12:29 PM Pager:  [phone]

## 2021-02-07 NOTE — Assessment & Plan Note (Signed)
-   Patient has symptoms of snoring, morning fatigue/headaches. Home sleep test 01/31/21 showed moderate OSA, AHI 21.6/hr with SpO2 low 75% (average 93%). We discussed risk of untreated sleep apnea and treatment options. She would like to hold off on CPAP therapy. Recommend she be evaluated by ENT for possible surgical options for treatment of her moderate OSA, if no surgical  intervention recommended or patient does not wish to proceed with procedure, advised she contact Dr. Toy Cookey with orthodontics for consideration oral appliance. Encourage side sleeping position, advised against driving if experiencing excessive daytime sleepiness. Follow-up in 6 months or sooner if needed.

## 2021-02-20 ENCOUNTER — Other Ambulatory Visit: Payer: Self-pay

## 2021-02-20 ENCOUNTER — Ambulatory Visit
Admission: RE | Admit: 2021-02-20 | Discharge: 2021-02-20 | Disposition: A | Discharge status: Home / Self Care | Payer: Commercial Managed Care - PPO | Source: Ambulatory Visit | Attending: Family Medicine | Admitting: Family Medicine

## 2021-02-20 ENCOUNTER — Ambulatory Visit: Payer: Commercial Managed Care - PPO

## 2021-02-20 DIAGNOSIS — R928 Other abnormal and inconclusive findings on diagnostic imaging of breast: Secondary | ICD-10-CM

## 2021-04-23 ENCOUNTER — Encounter: Payer: Self-pay | Admitting: Family Medicine

## 2021-04-23 ENCOUNTER — Ambulatory Visit (INDEPENDENT_AMBULATORY_CARE_PROVIDER_SITE_OTHER): Payer: Commercial Managed Care - PPO | Admitting: Family Medicine

## 2021-04-23 ENCOUNTER — Other Ambulatory Visit: Payer: Self-pay

## 2021-04-23 VITALS — BP 144/82 | HR 81 | Temp 97.9°F | Resp 16 | Ht 65.25 in | Wt 160.4 lb

## 2021-04-23 DIAGNOSIS — Z0000 Encounter for general adult medical examination without abnormal findings: Secondary | ICD-10-CM

## 2021-04-23 DIAGNOSIS — Z23 Encounter for immunization: Secondary | ICD-10-CM

## 2021-04-23 DIAGNOSIS — E559 Vitamin D deficiency, unspecified: Secondary | ICD-10-CM

## 2021-04-23 DIAGNOSIS — E538 Deficiency of other specified B group vitamins: Secondary | ICD-10-CM

## 2021-04-23 DIAGNOSIS — I10 Essential (primary) hypertension: Secondary | ICD-10-CM

## 2021-04-23 LAB — LIPID PANEL
Cholesterol: 185 mg/dL (ref 0–200)
HDL: 59.5 mg/dL (ref >39.00)
LDL Cholesterol: 110 mg/dL — ABNORMAL HIGH (ref 0–99)
NonHDL: 125.78
Total CHOL/HDL Ratio: 3
Triglycerides: 79 mg/dL (ref 0.0–149.0)
VLDL: 15.8 mg/dL (ref 0.0–40.0)

## 2021-04-23 LAB — BASIC METABOLIC PANEL
BUN: 15 mg/dL (ref 6–23)
CO2: 29 mEq/L (ref 19–32)
Calcium: 9.4 mg/dL (ref 8.4–10.5)
Chloride: 106 mEq/L (ref 96–112)
Creatinine, Ser: 0.82 mg/dL (ref 0.40–1.20)
GFR: 75.76 mL/min (ref >60.00)
Glucose, Bld: 91 mg/dL (ref 70–99)
Potassium: 4 mEq/L (ref 3.5–5.1)
Sodium: 141 mEq/L (ref 135–145)

## 2021-04-23 LAB — HEPATIC FUNCTION PANEL
ALT: 15 U/L (ref 0–35)
AST: 18 U/L (ref 0–37)
Albumin: 4.2 g/dL (ref 3.5–5.2)
Alkaline Phosphatase: 57 U/L (ref 39–117)
Bilirubin, Direct: 0.1 mg/dL (ref 0.0–0.3)
Total Bilirubin: 0.7 mg/dL (ref 0.2–1.2)
Total Protein: 7.1 g/dL (ref 6.0–8.3)

## 2021-04-23 LAB — CBC WITH DIFFERENTIAL/PLATELET
Basophils Absolute: 0 10*3/uL (ref 0.0–0.1)
Basophils Relative: 0.4 % (ref 0.0–3.0)
Eosinophils Absolute: 0 10*3/uL (ref 0.0–0.7)
Eosinophils Relative: 1.4 % (ref 0.0–5.0)
HCT: 39.2 % (ref 36.0–46.0)
Hemoglobin: 13 g/dL (ref 12.0–15.0)
Lymphocytes Relative: 42.8 % (ref 12.0–46.0)
Lymphs Abs: 1.1 10*3/uL (ref 0.7–4.0)
MCHC: 33.1 g/dL (ref 30.0–36.0)
MCV: 100.5 fl — ABNORMAL HIGH (ref 78.0–100.0)
Monocytes Absolute: 0.2 10*3/uL (ref 0.1–1.0)
Monocytes Relative: 7 % (ref 3.0–12.0)
Neutro Abs: 1.3 10*3/uL — ABNORMAL LOW (ref 1.4–7.7)
Neutrophils Relative %: 48.4 % (ref 43.0–77.0)
Platelets: 170 10*3/uL (ref 150.0–400.0)
RBC: 3.9 Mil/uL (ref 3.87–5.11)
RDW: 13.2 % (ref 11.5–15.5)
WBC: 2.6 10*3/uL — ABNORMAL LOW (ref 4.0–10.5)

## 2021-04-23 LAB — TSH: TSH: 1.25 u[IU]/mL (ref 0.35–5.50)

## 2021-04-23 LAB — VITAMIN B12: Vitamin B-12: 263 pg/mL (ref 211–911)

## 2021-04-23 LAB — VITAMIN D 25 HYDROXY (VIT D DEFICIENCY, FRACTURES): VITD: 22.27 ng/mL — ABNORMAL LOW (ref 30.00–100.00)

## 2021-04-23 NOTE — Patient Instructions (Addendum)
Follow up in 6 months to recheck BP and cholesterol ?We'll notify you of your lab results and make any changes if needed ?Keep up the good work on healthy diet and regular exercise- you look great! ?Call with any questions or concerns ?Stay Safe!  Stay Healthy! ?Happy Spring!!! ?

## 2021-04-23 NOTE — Progress Notes (Signed)
? ?Subjective:  ? ? Patient ID: Brandy May, female    DOB: 1955/06/30, 66 y.o.   MRN: [id number] ? ?HPI ?CPE- UTD on pap (attempting to get records), UTD on mammo, colonoscopy, Tdap.  Pt to get Shingrix today.  No concerns today ? ?Patient Care Team  ?  Relationship Specialty Notifications Start End  ?Midge Minium, MD PCP - General Family Medicine  12/23/10   ?Epimenio Foot., MD Consulting Physician Dentistry  10/11/14   ?McKenzie, Candee Furbish, MD Consulting Physician Urology  10/11/14   ?Keene Breath., MD Consulting Physician Ophthalmology  10/11/14   ?Servando Salina, MD Consulting Physician Obstetrics and Gynecology  10/11/14   ?Richmond Campbell, MD Consulting Physician Gastroenterology  10/11/14   ?  ?Health Maintenance  ?Topic Date Due  ?? HIV Screening  Never done  ?? COVID-19 Vaccine (4 - Booster for [place] series) 02/23/2020  ?? Zoster Vaccines- Shingrix (2 of 2) 12/18/2020  ?? PAP SMEAR-Modifier  01/04/2021  ?? MAMMOGRAM  01/08/2022  ?? COLONOSCOPY (Pts 45-17yr Insurance coverage will need to be confirmed)  06/26/2027  ?? TETANUS/TDAP  06/08/2029  ?? INFLUENZA VACCINE  Completed  ?? Hepatitis C Screening  Completed  ?? HPV VACCINES  Aged Out  ?  ? ? ?Review of Systems ?Patient reports no vision/ hearing changes, adenopathy,fever, weight change,  persistant/recurrent hoarseness , swallowing issues, chest pain, palpitations, edema, persistant/recurrent cough, hemoptysis, dyspnea (rest/exertional/paroxysmal nocturnal), gastrointestinal bleeding (melena, rectal bleeding), abdominal pain, significant heartburn, bowel changes, GU symptoms (dysuria, hematuria, incontinence), Gyn symptoms (abnormal  bleeding, pain),  syncope, focal weakness, memory loss, numbness & tingling, skin/hair/nail changes, abnormal bruising or bleeding, anxiety, or depression.  ? ?This visit occurred during the SARS-CoV-2 public health emergency.  Safety protocols were in place, including screening questions prior to  the visit, additional usage of staff PPE, and extensive cleaning of exam room while observing appropriate contact time as indicated for disinfecting solutions.   ?   ?Objective:  ? Physical Exam ?General Appearance:    Alert, cooperative, no distress, appears stated age  ?Head:    Normocephalic, without obvious abnormality, atraumatic  ?Eyes:    PERRL, conjunctiva/corneas clear, EOM's intact, both eyes  ?Ears:    Normal TM's and external ear canals, both ears  ?Nose:   Nares normal, septum midline, mucosa normal, no drainage  ?  or sinus tenderness  ?Throat:   Lips, mucosa, and tongue normal; teeth and gums normal  ?Neck:   Supple, symmetrical, trachea midline, no adenopathy;  ?  Thyroid: diffusely enlarged  ?Back:     Symmetric, no curvature, ROM normal, no CVA tenderness  ?Lungs:     Clear to auscultation bilaterally, respirations unlabored  ?Chest Wall:    No tenderness or deformity  ? Heart:    Regular rate and rhythm, S1 and S2 normal, no murmur, rub ?  or gallop  ?Breast Exam:    Deferred to GYN  ?Abdomen:     Soft, non-tender, bowel sounds active all four quadrants,  ?  no masses, no organomegaly  ?Genitalia:    Deferred to GYN  ?Rectal:    ?Extremities:   Extremities normal, atraumatic, no cyanosis or edema  ?Pulses:   2+ and symmetric all extremities  ?Skin:   Skin color, texture, turgor normal, no rashes or lesions  ?Lymph nodes:   Cervical, supraclavicular, and axillary nodes normal  ?Neurologic:   CNII-XII intact, normal strength, sensation and reflexes  ?  throughout  ?  ? ? ? ?   ?  Assessment & Plan:  ? ? ?

## 2021-04-24 ENCOUNTER — Telehealth: Payer: Self-pay

## 2021-04-24 MED ORDER — VITAMIN D (ERGOCALCIFEROL) 1.25 MG (50000 UNIT) PO CAPS: 50000.0000 [IU] | ORAL_CAPSULE | ORAL | 0 refills | Status: DC

## 2021-04-24 NOTE — Telephone Encounter (Signed)
-----   Message from Midge Minium, MD sent at 04/24/2021  7:32 AM EDT ----- ?Labs look great w/ exception of low Vit D.  Based on this, we need to start 50,000 units weekly x12 weeks in addition to daily OTC supplement of at least 2000 units.  ?

## 2021-04-24 NOTE — Telephone Encounter (Signed)
Spoke to patient she is aware of her labs and aware that the vitamin d will be sent in ?

## 2021-04-26 ENCOUNTER — Encounter: Payer: Commercial Managed Care - PPO | Admitting: Family Medicine

## 2021-05-28 ENCOUNTER — Encounter: Payer: Self-pay | Admitting: Family Medicine

## 2021-06-04 NOTE — Assessment & Plan Note (Signed)
Pt's PE WNL and unchanged from previous.  UTD on pap, mammo, colonoscopy, Tdap.  Shingrix today.  Check labs.  Anticipatory guidance provided.  ?

## 2021-06-04 NOTE — Assessment & Plan Note (Signed)
Check labs and replete prn. 

## 2021-06-04 NOTE — Assessment & Plan Note (Signed)
BP not at goal today, but not too far off.  Will hold on medication adjustment at this time but continue to monitor.  Pt expressed understanding and is in agreement w/ plan.  ?

## 2021-07-24 ENCOUNTER — Other Ambulatory Visit (HOSPITAL_BASED_OUTPATIENT_CLINIC_OR_DEPARTMENT_OTHER): Payer: Self-pay

## 2021-08-19 ENCOUNTER — Telehealth: Payer: Self-pay | Admitting: Family Medicine

## 2021-10-28 ENCOUNTER — Ambulatory Visit (INDEPENDENT_AMBULATORY_CARE_PROVIDER_SITE_OTHER): Payer: Commercial Managed Care - PPO | Admitting: Family Medicine

## 2021-10-28 ENCOUNTER — Encounter: Payer: Self-pay | Admitting: Family Medicine

## 2021-10-28 VITALS — BP 128/88 | HR 90 | Temp 100.6°F | Resp 16 | Ht 65.0 in | Wt 165.2 lb

## 2021-10-28 DIAGNOSIS — Z23 Encounter for immunization: Secondary | ICD-10-CM

## 2021-10-28 DIAGNOSIS — E663 Overweight: Secondary | ICD-10-CM | POA: Diagnosis not present

## 2021-10-28 DIAGNOSIS — E785 Hyperlipidemia, unspecified: Secondary | ICD-10-CM | POA: Diagnosis not present

## 2021-10-28 DIAGNOSIS — I10 Essential (primary) hypertension: Secondary | ICD-10-CM

## 2021-10-28 LAB — BASIC METABOLIC PANEL
BUN: 15 mg/dL (ref 6–23)
CO2: 28 mEq/L (ref 19–32)
Calcium: 9.4 mg/dL (ref 8.4–10.5)
Chloride: 105 mEq/L (ref 96–112)
Creatinine, Ser: 0.88 mg/dL (ref 0.40–1.20)
GFR: 69.36 mL/min (ref >60.00)
Glucose, Bld: 88 mg/dL (ref 70–99)
Potassium: 3.6 mEq/L (ref 3.5–5.1)
Sodium: 141 mEq/L (ref 135–145)

## 2021-10-28 LAB — CBC WITH DIFFERENTIAL/PLATELET
Basophils Absolute: 0 10*3/uL (ref 0.0–0.1)
Basophils Relative: 1.1 % (ref 0.0–3.0)
Eosinophils Absolute: 0 10*3/uL (ref 0.0–0.7)
Eosinophils Relative: 1.6 % (ref 0.0–5.0)
HCT: 39.6 % (ref 36.0–46.0)
Hemoglobin: 13.3 g/dL (ref 12.0–15.0)
Lymphocytes Relative: 43.5 % (ref 12.0–46.0)
Lymphs Abs: 1.2 10*3/uL (ref 0.7–4.0)
MCHC: 33.6 g/dL (ref 30.0–36.0)
MCV: 99.5 fl (ref 78.0–100.0)
Monocytes Absolute: 0.2 10*3/uL (ref 0.1–1.0)
Monocytes Relative: 8.4 % (ref 3.0–12.0)
Neutro Abs: 1.2 10*3/uL — ABNORMAL LOW (ref 1.4–7.7)
Neutrophils Relative %: 45.4 % (ref 43.0–77.0)
Platelets: 191 10*3/uL (ref 150.0–400.0)
RBC: 3.98 Mil/uL (ref 3.87–5.11)
RDW: 13.4 % (ref 11.5–15.5)
WBC: 2.8 10*3/uL — ABNORMAL LOW (ref 4.0–10.5)

## 2021-10-28 LAB — LIPID PANEL
Cholesterol: 200 mg/dL (ref 0–200)
HDL: 52.3 mg/dL (ref >39.00)
LDL Cholesterol: 135 mg/dL — ABNORMAL HIGH (ref 0–99)
NonHDL: 147.8
Total CHOL/HDL Ratio: 4
Triglycerides: 62 mg/dL (ref 0.0–149.0)
VLDL: 12.4 mg/dL (ref 0.0–40.0)

## 2021-10-28 LAB — HEPATIC FUNCTION PANEL
ALT: 13 U/L (ref 0–35)
AST: 19 U/L (ref 0–37)
Albumin: 4.2 g/dL (ref 3.5–5.2)
Alkaline Phosphatase: 51 U/L (ref 39–117)
Bilirubin, Direct: 0.1 mg/dL (ref 0.0–0.3)
Total Bilirubin: 0.6 mg/dL (ref 0.2–1.2)
Total Protein: 7.7 g/dL (ref 6.0–8.3)

## 2021-10-28 LAB — TSH: TSH: 1 u[IU]/mL (ref 0.35–5.50)

## 2021-10-28 NOTE — Assessment & Plan Note (Signed)
Ongoing issue.  Pt has gained 5 lbs since mom's passing this summer.  She plans to resume her regular exercise schedule this fall.  Will continue to follow.

## 2021-10-28 NOTE — Patient Instructions (Signed)
Schedule your complete physical in 6 months We'll notify you of your lab results and make any changes if needed Continue to work on healthy diet and regular exercise- you look great!! Call with any questions or concerns Stay Safe!  Stay Healthy!! Brandy May in there!!!

## 2021-10-28 NOTE — Assessment & Plan Note (Signed)
Chronic problem.  On Amlodipine '5mg'$  daily w/ adequate control- especially considering she is having a very hard time being here today since mom passed this summer.  Currently asymptomatic.  No med changes at this time.  Will follow.

## 2021-10-28 NOTE — Progress Notes (Signed)
   Subjective:    Patient ID: Brandy May, female    DOB: [birth year], 66 y.o.   MRN: [id number]  HPI HTN- chronic problem, on Amlodipine '5mg'$  daily.  No CP, SOB, HAs, visual changes, edema.  Hyperlipidemia- chronic problem, on Crestor '10mg'$  daily.  Denies abd pain, N/V.  Overweight- pt has gained 5 lbs since last visit.  Mom passed this summer and pt is having a very hard time.  Is trying to return to exercise.   Review of Systems For ROS see HPI     Objective:   Physical Exam Vitals reviewed.  Constitutional:      General: She is not in acute distress.    Appearance: Normal appearance. She is well-developed. She is not ill-appearing.  HENT:     Head: Normocephalic and atraumatic.  Eyes:     Conjunctiva/sclera: Conjunctivae normal.     Pupils: Pupils are equal, round, and reactive to light.  Neck:     Thyroid: No thyromegaly.  Cardiovascular:     Rate and Rhythm: Normal rate and regular rhythm.     Pulses: Normal pulses.     Heart sounds: Normal heart sounds. No murmur heard. Pulmonary:     Effort: Pulmonary effort is normal. No respiratory distress.     Breath sounds: Normal breath sounds.  Abdominal:     General: There is no distension.     Palpations: Abdomen is soft.     Tenderness: There is no abdominal tenderness.  Musculoskeletal:     Cervical back: Normal range of motion and neck supple.  Lymphadenopathy:     Cervical: No cervical adenopathy.  Skin:    General: Skin is warm and dry.  Neurological:     Mental Status: She is alert and oriented to person, place, and time.  Psychiatric:        Mood and Affect: Mood normal.        Behavior: Behavior normal.        Thought Content: Thought content normal.     Comments: Appropriately tearful when talking about mom           Assessment & Plan:

## 2021-10-28 NOTE — Assessment & Plan Note (Signed)
Chronic problem.  On Crestor 10mg daily w/o difficulty.  Check labs.  Adjust meds prn  

## 2021-10-29 NOTE — Progress Notes (Signed)
Pt seen results Via my chart  

## 2022-01-22 ENCOUNTER — Other Ambulatory Visit: Payer: Self-pay | Admitting: Family Medicine

## 2022-02-17 ENCOUNTER — Other Ambulatory Visit: Payer: Self-pay | Admitting: Family Medicine

## 2022-02-17 DIAGNOSIS — Z1231 Encounter for screening mammogram for malignant neoplasm of breast: Secondary | ICD-10-CM

## 2022-02-18 ENCOUNTER — Ambulatory Visit
Admission: RE | Admit: 2022-02-18 | Discharge: 2022-02-18 | Disposition: A | Discharge status: Home / Self Care | Payer: Commercial Managed Care - PPO | Source: Ambulatory Visit | Attending: Family Medicine | Admitting: Family Medicine

## 2022-02-18 DIAGNOSIS — Z1231 Encounter for screening mammogram for malignant neoplasm of breast: Secondary | ICD-10-CM

## 2022-02-20 ENCOUNTER — Other Ambulatory Visit: Payer: Self-pay | Admitting: Obstetrics and Gynecology

## 2022-02-20 DIAGNOSIS — R928 Other abnormal and inconclusive findings on diagnostic imaging of breast: Secondary | ICD-10-CM

## 2022-02-26 ENCOUNTER — Ambulatory Visit: Payer: Commercial Managed Care - PPO

## 2022-02-26 ENCOUNTER — Ambulatory Visit
Admission: RE | Admit: 2022-02-26 | Discharge: 2022-02-26 | Disposition: A | Discharge status: Home / Self Care | Payer: Commercial Managed Care - PPO | Source: Ambulatory Visit | Attending: Obstetrics and Gynecology | Admitting: Obstetrics and Gynecology

## 2022-02-26 DIAGNOSIS — R928 Other abnormal and inconclusive findings on diagnostic imaging of breast: Secondary | ICD-10-CM

## 2022-04-29 ENCOUNTER — Encounter: Payer: Self-pay | Admitting: Family Medicine

## 2022-04-29 ENCOUNTER — Ambulatory Visit (INDEPENDENT_AMBULATORY_CARE_PROVIDER_SITE_OTHER): Payer: Commercial Managed Care - PPO | Admitting: Family Medicine

## 2022-04-29 VITALS — BP 122/86 | HR 82 | Temp 98.1°F | Resp 17 | Ht 65.0 in | Wt 167.5 lb

## 2022-04-29 DIAGNOSIS — Z0000 Encounter for general adult medical examination without abnormal findings: Secondary | ICD-10-CM

## 2022-04-29 DIAGNOSIS — E559 Vitamin D deficiency, unspecified: Secondary | ICD-10-CM

## 2022-04-29 DIAGNOSIS — I10 Essential (primary) hypertension: Secondary | ICD-10-CM

## 2022-04-29 DIAGNOSIS — E538 Deficiency of other specified B group vitamins: Secondary | ICD-10-CM

## 2022-04-29 DIAGNOSIS — Z23 Encounter for immunization: Secondary | ICD-10-CM | POA: Diagnosis not present

## 2022-04-29 LAB — CBC WITH DIFFERENTIAL/PLATELET
Basophils Absolute: 0 10*3/uL (ref 0.0–0.1)
Basophils Relative: 0.7 % (ref 0.0–3.0)
Eosinophils Absolute: 0.1 10*3/uL (ref 0.0–0.7)
Eosinophils Relative: 2.6 % (ref 0.0–5.0)
HCT: 40.8 % (ref 36.0–46.0)
Hemoglobin: 13.6 g/dL (ref 12.0–15.0)
Lymphocytes Relative: 36.7 % (ref 12.0–46.0)
Lymphs Abs: 1.2 10*3/uL (ref 0.7–4.0)
MCHC: 33.3 g/dL (ref 30.0–36.0)
MCV: 99.2 fl (ref 78.0–100.0)
Monocytes Absolute: 0.2 10*3/uL (ref 0.1–1.0)
Monocytes Relative: 6.3 % (ref 3.0–12.0)
Neutro Abs: 1.8 10*3/uL (ref 1.4–7.7)
Neutrophils Relative %: 53.7 % (ref 43.0–77.0)
Platelets: 203 10*3/uL (ref 150.0–400.0)
RBC: 4.11 Mil/uL (ref 3.87–5.11)
RDW: 13.2 % (ref 11.5–15.5)
WBC: 3.3 10*3/uL — ABNORMAL LOW (ref 4.0–10.5)

## 2022-04-29 LAB — HEPATIC FUNCTION PANEL
ALT: 14 U/L (ref 0–35)
AST: 20 U/L (ref 0–37)
Albumin: 4.1 g/dL (ref 3.5–5.2)
Alkaline Phosphatase: 57 U/L (ref 39–117)
Bilirubin, Direct: 0.1 mg/dL (ref 0.0–0.3)
Total Bilirubin: 0.5 mg/dL (ref 0.2–1.2)
Total Protein: 7.5 g/dL (ref 6.0–8.3)

## 2022-04-29 LAB — BASIC METABOLIC PANEL
BUN: 14 mg/dL (ref 6–23)
CO2: 29 mEq/L (ref 19–32)
Calcium: 9.5 mg/dL (ref 8.4–10.5)
Chloride: 107 mEq/L (ref 96–112)
Creatinine, Ser: 0.82 mg/dL (ref 0.40–1.20)
GFR: 75.22 mL/min (ref >60.00)
Glucose, Bld: 96 mg/dL (ref 70–99)
Potassium: 3.7 mEq/L (ref 3.5–5.1)
Sodium: 140 mEq/L (ref 135–145)

## 2022-04-29 LAB — LIPID PANEL
Cholesterol: 176 mg/dL (ref 0–200)
HDL: 50.9 mg/dL (ref >39.00)
LDL Cholesterol: 109 mg/dL — ABNORMAL HIGH (ref 0–99)
NonHDL: 125.12
Total CHOL/HDL Ratio: 3
Triglycerides: 80 mg/dL (ref 0.0–149.0)
VLDL: 16 mg/dL (ref 0.0–40.0)

## 2022-04-29 LAB — B12 AND FOLATE PANEL
Folate: >23.8 ng/mL (ref >5.9)
Vitamin B-12: 432 pg/mL (ref 211–911)

## 2022-04-29 LAB — TSH: TSH: 1.02 u[IU]/mL (ref 0.35–5.50)

## 2022-04-29 LAB — VITAMIN D 25 HYDROXY (VIT D DEFICIENCY, FRACTURES): VITD: 30.71 ng/mL (ref 30.00–100.00)

## 2022-04-29 NOTE — Assessment & Plan Note (Signed)
Check labs and replete prn. 

## 2022-04-29 NOTE — Assessment & Plan Note (Signed)
Chronic problem, well controlled.  Currently asymptomatic.  Check labs but no anticipated med changes.

## 2022-04-29 NOTE — Assessment & Plan Note (Signed)
Pt's PE WNL.  UTD on mammo, pap, colonoscopy.  PNA vaccine given today.  Check labs.  Anticipatory guidance provided.

## 2022-04-29 NOTE — Patient Instructions (Addendum)
Follow up in 6 months to recheck BP and cholesterol We'll notify you of your lab results and make any changes if needed Keep up the good work on healthy diet and regular exercise- you look great! You are up to date on Tdap (which is what you need for the new baby) Call with any questions or concerns Stay Safe!  Stay Healthy! CONGRATS GRANDMA!!!

## 2022-04-29 NOTE — Progress Notes (Signed)
   Subjective:    Patient ID: Brandy May, female    DOB: 11-Feb-1955, 67 y.o.   MRN: [id number]  HPI CPE- UTD on mammo, pap, colonoscopy.  Due for PNA vaccine.  Pt had COVID and RSV vaccines in January.  Patient Care Team    Relationship Specialty Notifications Start End  Midge Minium, MD PCP - General Family Medicine  12/23/10   Epimenio Foot., MD Consulting Physician Dentistry  10/11/14   Cleon Gustin, MD Consulting Physician Urology  10/11/14   Keene Breath., MD Consulting Physician Ophthalmology  10/11/14   Servando Salina, MD Consulting Physician Obstetrics and Gynecology  10/11/14   Richmond Campbell, MD Consulting Physician Gastroenterology  10/11/14     Health Maintenance  Topic Date Due   Pneumonia Vaccine 39+ Years old (1 of 1 - PCV) Never done   INFLUENZA VACCINE  08/28/2022   MAMMOGRAM  02/19/2023   PAP SMEAR-Modifier  01/11/2024   COLONOSCOPY (Pts 45-31yrs Insurance coverage will need to be confirmed)  06/26/2027   DTaP/Tdap/Td (3 - Td or Tdap) 06/08/2029   DEXA SCAN  Completed   Hepatitis C Screening  Completed   Zoster Vaccines- Shingrix  Completed   HPV VACCINES  Aged Out   COVID-19 Vaccine  Discontinued   HIV Screening  Discontinued      Review of Systems Patient reports no vision/ hearing changes, adenopathy,fever, weight change,  persistant/recurrent hoarseness , swallowing issues, chest pain, palpitations, edema, persistant/recurrent cough, hemoptysis, dyspnea (rest/exertional/paroxysmal nocturnal), gastrointestinal bleeding (melena, rectal bleeding), abdominal pain, significant heartburn, bowel changes, GU symptoms (dysuria, hematuria, incontinence), Gyn symptoms (abnormal  bleeding, pain),  syncope, focal weakness, memory loss, numbness & tingling, skin/hair/nail changes, abnormal bruising or bleeding, anxiety, or depression.     Objective:   Physical Exam General Appearance:    Alert, cooperative, no distress, appears stated age   Head:    Normocephalic, without obvious abnormality, atraumatic  Eyes:    PERRL, conjunctiva/corneas clear, EOM's intact both eyes  Ears:    Normal TM's and external ear canals, both ears  Nose:   Nares normal, septum midline, mucosa normal, no drainage    or sinus tenderness  Throat:   Lips, mucosa, and tongue normal; teeth and gums normal  Neck:   Supple, symmetrical, trachea midline, no adenopathy;    Thyroid: no enlargement/tenderness/nodules  Back:     Symmetric, no curvature, ROM normal, no CVA tenderness  Lungs:     Clear to auscultation bilaterally, respirations unlabored  Chest Wall:    No tenderness or deformity   Heart:    Regular rate and rhythm, S1 and S2 normal, no murmur, rub   or gallop  Breast Exam:    Deferred to GYN  Abdomen:     Soft, non-tender, bowel sounds active all four quadrants,    no masses, no organomegaly  Genitalia:    Deferred to GYN  Rectal:    Extremities:   Extremities normal, atraumatic, no cyanosis or edema  Pulses:   2+ and symmetric all extremities  Skin:   Skin color, texture, turgor normal, no rashes or lesions  Lymph nodes:   Cervical, supraclavicular, and axillary nodes normal  Neurologic:   CNII-XII intact, normal strength, sensation and reflexes    throughout          Assessment & Plan:

## 2022-04-30 ENCOUNTER — Telehealth: Payer: Self-pay

## 2022-04-30 NOTE — Telephone Encounter (Signed)
Left lab results on pt VM 

## 2022-04-30 NOTE — Telephone Encounter (Signed)
-----   Message from Midge Minium, MD sent at 04/30/2022  7:35 AM EDT ----- Labs look great!  No changes at this time

## 2022-10-29 ENCOUNTER — Ambulatory Visit: Payer: Commercial Managed Care - PPO | Admitting: Family Medicine

## 2022-10-31 ENCOUNTER — Encounter: Payer: Self-pay | Admitting: Family Medicine

## 2022-10-31 ENCOUNTER — Ambulatory Visit (INDEPENDENT_AMBULATORY_CARE_PROVIDER_SITE_OTHER): Payer: Commercial Managed Care - PPO | Admitting: Family Medicine

## 2022-10-31 VITALS — BP 124/76 | HR 70 | Temp 97.8°F | Ht 65.0 in | Wt 173.8 lb

## 2022-10-31 DIAGNOSIS — I10 Essential (primary) hypertension: Secondary | ICD-10-CM

## 2022-10-31 DIAGNOSIS — Z23 Encounter for immunization: Secondary | ICD-10-CM | POA: Diagnosis not present

## 2022-10-31 DIAGNOSIS — E785 Hyperlipidemia, unspecified: Secondary | ICD-10-CM

## 2022-10-31 DIAGNOSIS — E663 Overweight: Secondary | ICD-10-CM

## 2022-10-31 LAB — TSH: TSH: 1.47 u[IU]/mL (ref 0.35–5.50)

## 2022-10-31 LAB — CBC WITH DIFFERENTIAL/PLATELET
Basophils Absolute: 0 10*3/uL (ref 0.0–0.1)
Basophils Relative: 1.1 % (ref 0.0–3.0)
Eosinophils Absolute: 0.1 10*3/uL (ref 0.0–0.7)
Eosinophils Relative: 3 % (ref 0.0–5.0)
HCT: 38.1 % (ref 36.0–46.0)
Hemoglobin: 12.3 g/dL (ref 12.0–15.0)
Lymphocytes Relative: 47.5 % — ABNORMAL HIGH (ref 12.0–46.0)
Lymphs Abs: 1.2 10*3/uL (ref 0.7–4.0)
MCHC: 32.3 g/dL (ref 30.0–36.0)
MCV: 101 fL — ABNORMAL HIGH (ref 78.0–100.0)
Monocytes Absolute: 0.2 10*3/uL (ref 0.1–1.0)
Monocytes Relative: 8.2 % (ref 3.0–12.0)
Neutro Abs: 1.1 10*3/uL — ABNORMAL LOW (ref 1.4–7.7)
Neutrophils Relative %: 40.2 % — ABNORMAL LOW (ref 43.0–77.0)
Platelets: 206 10*3/uL (ref 150.0–400.0)
RBC: 3.77 Mil/uL — ABNORMAL LOW (ref 3.87–5.11)
RDW: 13.4 % (ref 11.5–15.5)
WBC: 2.6 10*3/uL — ABNORMAL LOW (ref 4.0–10.5)

## 2022-10-31 LAB — HEPATIC FUNCTION PANEL
ALT: 18 U/L (ref 0–35)
AST: 22 U/L (ref 0–37)
Albumin: 3.9 g/dL (ref 3.5–5.2)
Alkaline Phosphatase: 58 U/L (ref 39–117)
Bilirubin, Direct: 0.1 mg/dL (ref 0.0–0.3)
Total Bilirubin: 0.6 mg/dL (ref 0.2–1.2)
Total Protein: 7.2 g/dL (ref 6.0–8.3)

## 2022-10-31 LAB — BASIC METABOLIC PANEL
BUN: 12 mg/dL (ref 6–23)
CO2: 30 meq/L (ref 19–32)
Calcium: 9.4 mg/dL (ref 8.4–10.5)
Chloride: 106 meq/L (ref 96–112)
Creatinine, Ser: 0.81 mg/dL (ref 0.40–1.20)
GFR: 76.07 mL/min (ref >60.00)
Glucose, Bld: 94 mg/dL (ref 70–99)
Potassium: 4 meq/L (ref 3.5–5.1)
Sodium: 142 meq/L (ref 135–145)

## 2022-10-31 LAB — LIPID PANEL
Cholesterol: 183 mg/dL (ref 0–200)
HDL: 61.6 mg/dL (ref >39.00)
LDL Cholesterol: 108 mg/dL — ABNORMAL HIGH (ref 0–99)
NonHDL: 121
Total CHOL/HDL Ratio: 3
Triglycerides: 65 mg/dL (ref 0.0–149.0)
VLDL: 13 mg/dL (ref 0.0–40.0)

## 2022-10-31 MED ORDER — ROSUVASTATIN CALCIUM 10 MG PO TABS
10.0000 mg | ORAL_TABLET | Freq: Every day | ORAL | 3 refills | Status: DC
Start: 2022-10-31 — End: 2023-11-03

## 2022-10-31 MED ORDER — AMLODIPINE BESYLATE 5 MG PO TABS: 5.0000 mg | ORAL_TABLET | Freq: Every day | ORAL | 3 refills | Status: DC

## 2022-10-31 NOTE — Assessment & Plan Note (Signed)
Chronic problem.  On Crestor 10mg daily w/o difficulty.  Check labs.  Adjust meds prn  

## 2022-10-31 NOTE — Assessment & Plan Note (Signed)
Chronic problem.  Well controlled on Amlodipine 5mg daily.  Currently asymptomatic.  No med changes at this time 

## 2022-10-31 NOTE — Patient Instructions (Signed)
Schedule your complete physical in 6 months We'll notify you of your lab results and make any changes if needed Continue to work on healthy diet and regular exercise- you can do it! Call with any questions or concerns Stay Safe!  Stay Healthy! CONGRATS ON THE GRANDBABY!!!

## 2022-10-31 NOTE — Assessment & Plan Note (Signed)
Ongoing issue for pt.  She has gained 6 lbs since last visit.  Encouraged low carb diet and regular exercise.  Check labs to risk stratify.  Will follow.

## 2022-10-31 NOTE — Progress Notes (Signed)
   Subjective:    Patient ID: Brandy May, female    DOB: 05-28-1955, 67 y.o.   MRN: [id number]  HPI HTN- chronic problem, on Amlodipine 5mg  daily.  No CP, SOB, HA's, visual changes, edema.  Hyperlipidemia- chronic problem, on Crestor 10mg  daily.  No abd pain, N/V.  Overweight- pt has gained 6 lbs since last visit.  BMI now 28.92.  admits she has not been exercising but does have a [employer] membership. She and her husband are going to start together.  Review of Systems For ROS see HPI     Objective:   Physical Exam Vitals reviewed.  Constitutional:      General: She is not in acute distress.    Appearance: Normal appearance. She is well-developed. She is not ill-appearing.  HENT:     Head: Normocephalic and atraumatic.  Eyes:     Conjunctiva/sclera: Conjunctivae normal.     Pupils: Pupils are equal, round, and reactive to light.  Neck:     Thyroid: No thyromegaly.  Cardiovascular:     Rate and Rhythm: Normal rate and regular rhythm.     Pulses: Normal pulses.     Heart sounds: Normal heart sounds. No murmur heard. Pulmonary:     Effort: Pulmonary effort is normal. No respiratory distress.     Breath sounds: Normal breath sounds.  Abdominal:     General: There is no distension.     Palpations: Abdomen is soft.     Tenderness: There is no abdominal tenderness.  Musculoskeletal:     Cervical back: Normal range of motion and neck supple.     Right lower leg: No edema.     Left lower leg: No edema.  Lymphadenopathy:     Cervical: No cervical adenopathy.  Skin:    General: Skin is warm and dry.  Neurological:     General: No focal deficit present.     Mental Status: She is alert and oriented to person, place, and time.  Psychiatric:        Mood and Affect: Mood normal.        Behavior: Behavior normal.        Thought Content: Thought content normal.           Assessment & Plan:

## 2023-01-29 ENCOUNTER — Ambulatory Visit: Payer: Commercial Managed Care - PPO | Admitting: Family Medicine

## 2023-01-29 VITALS — BP 118/80 | HR 68 | Temp 97.8°F | Ht 65.0 in | Wt 171.1 lb

## 2023-01-29 DIAGNOSIS — M79621 Pain in right upper arm: Secondary | ICD-10-CM | POA: Diagnosis not present

## 2023-01-29 MED ORDER — PREDNISONE 10 MG PO TABS: ORAL_TABLET | ORAL | 0 refills | Status: DC

## 2023-01-29 NOTE — Patient Instructions (Addendum)
 Follow up as needed or as scheduled START the Prednisone  as directed- 3 pills at the same time x3 days, then 2 pills at the same time x3 days, then 1 pill daily.  Take w/ food  HEAT the arm before activity, ICE after Call with any questions or concerns Stay Safe!  Stay Healthy! Happy New Year!!
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
## 2023-01-30 ENCOUNTER — Emergency Department (HOSPITAL_BASED_OUTPATIENT_CLINIC_OR_DEPARTMENT_OTHER)
Admission: EM | Admit: 2023-01-30 | Discharge: 2023-01-30 | Disposition: A | Discharge status: Home / Self Care | Payer: Commercial Managed Care - PPO | Attending: Emergency Medicine | Admitting: Emergency Medicine

## 2023-01-30 ENCOUNTER — Emergency Department (HOSPITAL_BASED_OUTPATIENT_CLINIC_OR_DEPARTMENT_OTHER): Payer: Commercial Managed Care - PPO

## 2023-01-30 ENCOUNTER — Other Ambulatory Visit: Payer: Self-pay

## 2023-01-30 ENCOUNTER — Encounter (HOSPITAL_BASED_OUTPATIENT_CLINIC_OR_DEPARTMENT_OTHER): Payer: Self-pay | Admitting: Emergency Medicine

## 2023-01-30 DIAGNOSIS — Z79899 Other long term (current) drug therapy: Secondary | ICD-10-CM | POA: Diagnosis not present

## 2023-01-30 DIAGNOSIS — I10 Essential (primary) hypertension: Secondary | ICD-10-CM | POA: Insufficient documentation

## 2023-01-30 DIAGNOSIS — J189 Pneumonia, unspecified organism: Secondary | ICD-10-CM | POA: Insufficient documentation

## 2023-01-30 DIAGNOSIS — R109 Unspecified abdominal pain: Secondary | ICD-10-CM | POA: Insufficient documentation

## 2023-01-30 LAB — URINALYSIS, ROUTINE W REFLEX MICROSCOPIC
Bilirubin Urine: NEGATIVE
Glucose, UA: NEGATIVE mg/dL
Ketones, ur: NEGATIVE mg/dL
Leukocytes,Ua: NEGATIVE
Nitrite: NEGATIVE
Protein, ur: NEGATIVE mg/dL
Specific Gravity, Urine: >=1.03 (ref 1.005–1.030)
pH: 5.5 (ref 5.0–8.0)

## 2023-01-30 LAB — URINALYSIS, MICROSCOPIC (REFLEX)

## 2023-01-30 MED ORDER — OXYCODONE-ACETAMINOPHEN 5-325 MG PO TABS: 1.0000 | ORAL_TABLET | Freq: Four times a day (QID) | ORAL | 0 refills | Status: DC | PRN

## 2023-01-30 MED ORDER — TAMSULOSIN HCL 0.4 MG PO CAPS: 0.4000 mg | ORAL_CAPSULE | Freq: Every day | ORAL | 0 refills | Status: DC

## 2023-01-30 MED ORDER — ONDANSETRON HCL 4 MG PO TABS: 4.0000 mg | ORAL_TABLET | Freq: Three times a day (TID) | ORAL | 0 refills | Status: DC | PRN

## 2023-01-30 MED ORDER — OXYCODONE-ACETAMINOPHEN 5-325 MG PO TABS
1.0000 | ORAL_TABLET | ORAL | Status: DC | PRN
Start: 2023-01-30 — End: 2023-01-31
  Administered 2023-01-30: 1 via ORAL
  Filled 2023-01-30: qty 1

## 2023-01-30 MED ORDER — ONDANSETRON 4 MG PO TBDP
4.0000 mg | ORAL_TABLET | Freq: Once | ORAL | Status: AC
  Administered 2023-01-30: 4 mg via ORAL
  Filled 2023-01-30: qty 1

## 2023-01-30 MED ORDER — AZITHROMYCIN 250 MG PO TABS: 250.0000 mg | ORAL_TABLET | Freq: Every day | ORAL | 0 refills | Status: DC

## 2023-01-30 NOTE — Discharge Instructions (Addendum)
 We evaluated you for your flank pain.  Your CT scan does show a 1 mm kidney stone.  This should pass on its own.  Your urinalysis otherwise did not show any signs of infection.  I prescribed you some nausea medicine (Zofran ) which you can take every 8 hours as needed.  Have also prescribed you some Percocet which you can take every 6 hours as needed for pain.  Percocet can make you drowsy, so be careful when taking it, do not mix with alcohol, do not drive while taking this medicine.  I have also prescribed you Flomax .  This can help the stone pass more quickly.  If you do not have any more pain you do not need to keep taking it.  Your CT scan did show inflammation in your left lower lung, since you have had some cough, we will treat you with antibiotics.  I am not sure whether you have an actual pneumonia or this is just an abnormal x-ray finding.  We recommend repeating your CT scan (CT chest) in 6 months to make sure that this has resolved.  Please follow-up with your urologist and your primary doctor.  If you have any new or worsening symptoms such as difficulty breathing, worsening cough, fevers, or worsening flank pain, painful urination, lightheadedness or dizziness, fevers, please return to the emergency department.

## 2023-01-30 NOTE — ED Provider Notes (Signed)
 [place] [facility]MENT AT [facility] Provider Note  CSN: [id number] Arrival date & time: 01/30/23 1734  Chief Complaint(s) Flank Pain  HPI Brandy May is a 68 y.o. female with history of kidney stones, hypertension presenting to the emergency department with left flank pain.  Reports the pain began this morning, radiates around abdomen.  Reports it feels similar to prior kidney stones.  No painful urination.  Reports nausea, vomiting.  Tried to take some leftover Zofran  and hydrocodone without improvement.  No fevers or chills.  No chest pain or shortness of breath.  She does report she has had a nonproductive cough for the past week or so.  No sore throat, runny nose, or any other new symptoms.   Past Medical History Past Medical History:  Diagnosis Date   History of DVT of lower extremity 1991   w/ second pregancy   History of kidney stones 2016   History of peptic ulcer 1998   Hyperlipidemia    Hypertension    Wears glasses    Patient Active Problem List   Diagnosis Date Noted   Moderate obstructive sleep apnea 02/07/2021   Overweight (BMI 25.0-29.9) 06/09/2019   Hip arthritis 05/20/2018   Vitamin D  deficiency 10/24/2016   Bilateral low back pain without sciatica 07/17/2014   Pain, joint, hand 06/11/2012   Physical exam, annual 10/03/2011   Thyromegaly 04/11/2011   HTN (hypertension) 12/29/2010   Hyperlipidemia 12/29/2010   B12 deficiency 12/29/2010   Maternal DVT (deep vein thrombosis), history of 12/29/2010   Home Medication(s) Prior to Admission medications   Medication Sig Start Date End Date Taking? Authorizing Provider  azithromycin  (ZITHROMAX ) 250 MG tablet Take 1 tablet (250 mg total) by mouth daily. Take first 2 tablets together, then 1 every day until finished. 01/30/23  Yes Francesca Elsie CROME, MD  ondansetron  (ZOFRAN ) 4 MG tablet Take 1 tablet (4 mg total) by mouth every 8 (eight) hours as needed for nausea or vomiting. 01/30/23   Yes Francesca Elsie CROME, MD  oxyCODONE -acetaminophen  (PERCOCET/ROXICET) 5-325 MG tablet Take 1 tablet by mouth every 6 (six) hours as needed for severe pain (pain score 7-10). 01/30/23  Yes Francesca Elsie CROME, MD  tamsulosin  (FLOMAX ) 0.4 MG CAPS capsule Take 1 capsule (0.4 mg total) by mouth daily after supper. 01/30/23  Yes Francesca Elsie CROME, MD  amLODipine  (NORVASC ) 5 MG tablet Take 1 tablet (5 mg total) by mouth daily. 10/31/22   Tabori, Katherine E, MD  OSPHENA 60 MG TABS Take 1 tablet by mouth daily. 03/29/19   [provider]  predniSONE  (DELTASONE ) 10 MG tablet 3 tabs x3 days and then 2 tabs x3 days and then 1 tab x3 days.  Take w/ food. 01/29/23   Tabori, Katherine E, MD  rosuvastatin  (CRESTOR ) 10 MG tablet Take 1 tablet (10 mg total) by mouth daily. 10/31/22   Tabori, Katherine E, MD  Past Surgical History Past Surgical History:  Procedure Laterality Date   CESAREAN SECTION  1989 & 1991   CYSTOSCOPY W/ URETERAL STENT PLACEMENT Right 08/30/2014   Procedure: CYSTOSCOPY WITH STENT REPLACEMENT;  Surgeon: Belvie LITTIE Clara, MD;  Location: [facility];  Service: Urology;  Laterality: Right;   CYSTOSCOPY WITH URETEROSCOPY AND STENT PLACEMENT Right 08/16/2014   Procedure: CYSTOSCOPY WITH URETEROSCOPY AND STENT PLACEMENT;  Surgeon: Belvie LITTIE Clara, MD;  Location: [facility];  Service: Urology;  Laterality: Right;   CYSTOSCOPY/RETROGRADE/URETEROSCOPY Right 06/09/2016   Procedure: CYSTOSCOPY/RETROGRADE/URETEROSCOPY WITH LASER/STENT PLACEMENT AND STONE BASKETRY;  Surgeon: Clara Belvie LITTIE, MD;  Location: [facility];  Service: Urology;  Laterality: Right;   CYSTOSCOPY/RETROGRADE/URETEROSCOPY/STONE EXTRACTION WITH BASKET Right 08/30/2014   Procedure: CYSTOSCOPY/RETROGRADE/URETEROSCOPY/STONE EXTRACTION  ;  Surgeon: Belvie LITTIE Clara, MD;  Location: [facility];  Service: Urology;  Laterality: Right;   KNEE ARTHROSCOPY Right 07-19-2010   RIGHT THUMB TRIGGER RELEASE  2014   TUBAL LIGATION  1994   Family History Family History  Problem Relation Age of Onset   Arthritis Mother    Hyperlipidemia Mother    Hypertension Mother    Cancer Father    Hyperlipidemia Sister    Breast cancer Neg Hx     Social History Social History   Tobacco Use   Smoking status: Never   Smokeless tobacco: Never  Vaping Use   Vaping status: Never Used  Substance Use Topics   Alcohol use: No   Drug use: No   Allergies Kiwi extract and Coumadin [warfarin sodium]  Review of Systems Review of Systems  All other systems reviewed and are negative.   Physical Exam Vital Signs  I have reviewed the triage vital signs BP (!) 151/85   Pulse 71   Temp 98.8 F (37.1 C) (Oral)   Resp 14   LMP 01/13/2012   SpO2 99%  Physical Exam Vitals and nursing note reviewed.  Constitutional:      General: She is not in acute distress.    Appearance: She is well-developed.  HENT:     Head: Normocephalic and atraumatic.     Mouth/Throat:     Mouth: Mucous membranes are moist.  Eyes:     Pupils: Pupils are equal, round, and reactive to light.  Cardiovascular:     Rate and Rhythm: Normal rate and regular rhythm.     Heart sounds: No murmur heard. Pulmonary:     Effort: Pulmonary effort is normal. No respiratory distress.     Breath sounds: Normal breath sounds.  Abdominal:     General: Abdomen is flat.     Palpations: Abdomen is soft.     Tenderness: There is no abdominal tenderness. There is no right CVA tenderness or left CVA tenderness.  Musculoskeletal:        General: No tenderness.     Right lower leg: No edema.     Left lower leg: No edema.  Skin:    General: Skin is warm and dry.  Neurological:     General: No focal deficit present.     Mental Status: She is alert. Mental status is at baseline.   Psychiatric:        Mood and Affect: Mood normal.        Behavior: Behavior normal.     ED Results and Treatments Labs (all labs ordered are listed, but only abnormal results are displayed) Labs Reviewed  URINALYSIS, ROUTINE W REFLEX MICROSCOPIC -  Abnormal; Notable for the following components:      Result Value   Hgb urine dipstick MODERATE (*)    All other components within normal limits  URINALYSIS, MICROSCOPIC (REFLEX) - Abnormal; Notable for the following components:   Bacteria, UA RARE (*)    All other components within normal limits                                                                                                                          Radiology CT Renal Stone Study Result Date: 01/30/2023 CLINICAL DATA:  Left flank pain this morning. History of kidney stone. No urinary symptoms. Nausea and emesis. EXAM: CT ABDOMEN AND PELVIS WITHOUT CONTRAST TECHNIQUE: Multidetector CT imaging of the abdomen and pelvis was performed following the standard protocol without IV contrast. RADIATION DOSE REDUCTION: This exam was performed according to the departmental dose-optimization program which includes automated exposure control, adjustment of the mA and/or kV according to patient size and/or use of iterative reconstruction technique. COMPARISON:  08/02/2014 FINDINGS: Lower chest: Patchy ground-glass opacities in the left lower lobe and 6 mm nodular ground-glass opacity on series 303/image 5. Hepatobiliary: Unremarkable liver. Normal gallbladder. No biliary dilation. Pancreas: Unremarkable. Spleen: Unremarkable. Adrenals/Urinary Tract: Normal adrenal glands. Bilateral nonobstructing calyceal stones. No right hydronephrosis or ureteral calculi. There is mild left hydro ureteral nephrosis. The left UVJ is largely obscured by streak artifact from the left THA however there is a punctate hyperdensity at the left UVJ on coronal images measuring 1-2 mm (601/77). Bladder is unremarkable are  not obscured by streak artifact. Stomach/Bowel: Normal caliber large and small bowel. Colonic diverticulosis without diverticulitis. No bowel wall thickening. The appendix is normal.Stomach is within normal limits. Vascular/Lymphatic: Aortic atherosclerosis. No enlarged abdominal or pelvic lymph nodes. Reproductive: Unremarkable. Other: No free intraperitoneal fluid or air. Musculoskeletal: No acute fracture. IMPRESSION: 1. 1-2 mm stone at the left UVJ with mild left hydroureteronephrosis. 2. Additional bilateral nonobstructing calyceal stones. 3. Patchy ground-glass opacities in the left lower lobe and 6 mm nodular ground-glass opacity. This is likely due to atypical pneumonia however follow-up with CT at 6 months is recommended. If persistent, repeat CT is recommended every 2 years until 5 years of stability has been established. This recommendation follows the consensus statement: Guidelines for Management of Incidental Pulmonary Nodules Detected on CT Images: From the Fleischner Society 2017; Radiology 2017; 284:228-243. Aortic Atherosclerosis (ICD10-I70.0). Electronically Signed   By: Norman Gatlin M.D.   On: 01/30/2023 20:44    Pertinent labs & imaging results that were available during my care of the patient were reviewed by me and considered in my medical decision making (see MDM for details).  Medications Ordered in ED Medications  oxyCODONE -acetaminophen  (PERCOCET/ROXICET) 5-325 MG per tablet 1 tablet (1 tablet Oral Given 01/30/23 1752)  ondansetron  (ZOFRAN -ODT) disintegrating tablet 4 mg (4 mg Oral Given 01/30/23 1753)  Procedures Procedures  (including critical care time)  Medical Decision Making / ED Course   MDM:  68 year old female presenting to the emergency department flank pain.  Patient well-appearing, physical exam with no CVA tenderness,  otherwise unremarkable.  CT scan does demonstrate a 1 mm left UVJ stone.  Her urinalysis has some rare bacteria but otherwise no signs of infection, patient has had no dysuria, no fevers or urinary symptoms, no leukocytes or nitrites and urinalysis, low concern for underlying infectious process.  She received a Percocet and Zofran  and feels much better.  Will prescribe additional pain medication, nausea medicine, and Flomax .  Recommended follow-up with her urologist.  Bethena should hopefully pass on its own.   CT scan did show possible left lower lobe infiltrate.  On further history, the patient has had a cough.  Her lungs are clear on exam.  She has no productive cough.  Radiologist reports that this is most likely an atypical pneumonia.  Since she has had a cough, will treat with azithromycin  to cover for atypical infection, but I am not fully convinced that the patient actually has a pneumonia.  Discussed need for follow-up CT scan in 6 months.  Discussed strict return precautions for any worsening cough, productive cough, shortness of breath, fevers, lightheadedness or dizziness.  Will discharge patient to home. All questions answered. Patient comfortable with plan of discharge. Return precautions discussed with patient and specified on the after visit summary.   Clinical Course as of 01/30/23 2257  [place] Jan 30, 2023  2138 CT Renal Bethena Rear [WS]    Clinical Course User Index [WS] Francesca Elsie CROME, MD     Additional history obtained: -Additional history obtained from spouse -External records from outside source obtained and reviewed including: Chart review including previous notes, labs, imaging, consultation notes including pMH   Lab Tests: -I ordered, reviewed, and interpreted labs.   The pertinent results include:   Labs Reviewed  URINALYSIS, ROUTINE W REFLEX MICROSCOPIC - Abnormal; Notable for the following components:      Result Value   Hgb urine dipstick MODERATE (*)    All  other components within normal limits  URINALYSIS, MICROSCOPIC (REFLEX) - Abnormal; Notable for the following components:   Bacteria, UA RARE (*)    All other components within normal limits    Notable for hematuria   Imaging Studies ordered: I ordered imaging studies including CT  On my interpretation imaging demonstrates ? Pneumonia, UVJ  I independently visualized and interpreted imaging. I agree with the radiologist interpretation   Medicines ordered and prescription drug management: Meds ordered this encounter  Medications   oxyCODONE -acetaminophen  (PERCOCET/ROXICET) 5-325 MG per tablet 1 tablet    Refill:  0   ondansetron  (ZOFRAN -ODT) disintegrating tablet 4 mg   azithromycin  (ZITHROMAX ) 250 MG tablet    Sig: Take 1 tablet (250 mg total) by mouth daily. Take first 2 tablets together, then 1 every day until finished.    Dispense:  6 tablet    Refill:  0   oxyCODONE -acetaminophen  (PERCOCET/ROXICET) 5-325 MG tablet    Sig: Take 1 tablet by mouth every 6 (six) hours as needed for severe pain (pain score 7-10).    Dispense:  12 tablet    Refill:  0   ondansetron  (ZOFRAN ) 4 MG tablet    Sig: Take 1 tablet (4 mg total) by mouth every 8 (eight) hours as needed for nausea or vomiting.    Dispense:  16 tablet    Refill:  0  tamsulosin  (FLOMAX ) 0.4 MG CAPS capsule    Sig: Take 1 capsule (0.4 mg total) by mouth daily after supper.    Dispense:  30 capsule    Refill:  0    -I have reviewed the patients home medicines and have made adjustments as needed   Reevaluation: After the interventions noted above, I reevaluated the patient and found that their symptoms have improved  Co morbidities that complicate the patient evaluation  Past Medical History:  Diagnosis Date   History of DVT of lower extremity 1991   w/ second pregancy   History of kidney stones 2016   History of peptic ulcer 1998   Hyperlipidemia    Hypertension    Wears glasses        Dispostion: Disposition decision including need for hospitalization was considered, and patient discharged from emergency department.    Final Clinical Impression(s) / ED Diagnoses Final diagnoses:  Left flank pain  Atypical pneumonia     This chart was dictated using voice recognition software.  Despite best efforts to proofread,  errors can occur which can change the documentation meaning.    Francesca Elsie CROME, MD 01/30/23 2257

## 2023-01-30 NOTE — ED Triage Notes (Signed)
 Left flank pain this morning , Hx kidney stone , no urinary symptoms . Nausea and emesis , obvious distress

## 2023-02-02 ENCOUNTER — Telehealth: Payer: Self-pay

## 2023-02-02 NOTE — Telephone Encounter (Signed)
 She can continue her prednisone.  I'm so sorry she developed all of this after our visit!  I hope she is feeling better

## 2023-02-02 NOTE — Telephone Encounter (Signed)
 Copied from CRM [id number]. Topic: Clinical - Medication Question >> Feb 02, 2023  9:14 AM Truddie Crumble wrote: Reason for CRM: Pt would like to be called regarding an ER regarding her kidney stones and taking prednisone that Dr. Beverely Low prescribed to her

## 2023-02-02 NOTE — Telephone Encounter (Signed)
 Pt has been notified.

## 2023-02-04 ENCOUNTER — Encounter: Payer: Self-pay | Admitting: Family Medicine

## 2023-02-04 ENCOUNTER — Ambulatory Visit (INDEPENDENT_AMBULATORY_CARE_PROVIDER_SITE_OTHER): Payer: Commercial Managed Care - PPO | Admitting: Family Medicine

## 2023-02-04 VITALS — BP 122/78 | HR 78 | Temp 98.0°F | Ht 65.0 in | Wt 169.0 lb

## 2023-02-04 DIAGNOSIS — Z87442 Personal history of urinary calculi: Secondary | ICD-10-CM

## 2023-02-04 DIAGNOSIS — R918 Other nonspecific abnormal finding of lung field: Secondary | ICD-10-CM

## 2023-02-04 DIAGNOSIS — N200 Calculus of kidney: Secondary | ICD-10-CM

## 2023-02-04 NOTE — Progress Notes (Signed)
   Subjective:    Patient ID: Brandy May, female    DOB: 10-11-1955, 68 y.o.   MRN: [id number]  HPI Hospital f/u- pt went to ER on 1/3 w/ L flank pain that felt very similar to prior kidney stones.  + N/V.  No dysuria.  Also had unproductive cough upon presentation.  CT showed 2mm stone at L UVJ w/ mild hydroureteronephrosis, bilateral non-obstructing calyceal stones, and patchy ground glass opacity in L lower lobe.  They read this as atypical PNA and recommended repeat CT in 6 months and then q2 yrs x5 yrs.  She was sent home w/ Percocet, Zpack, Zofran , Flomax .  Started Prednisone  yesterday (was prescribed for arm pain).  Does have a urologist but has not been recently.     Review of Systems For ROS see HPI     Objective:   Physical Exam Vitals reviewed.  Constitutional:      General: She is not in acute distress.    Appearance: Normal appearance. She is well-developed. She is not ill-appearing.  HENT:     Head: Normocephalic and atraumatic.  Eyes:     Conjunctiva/sclera: Conjunctivae normal.     Pupils: Pupils are equal, round, and reactive to light.  Neck:     Thyroid : No thyromegaly.  Cardiovascular:     Rate and Rhythm: Normal rate and regular rhythm.     Heart sounds: Normal heart sounds. No murmur heard. Pulmonary:     Effort: Pulmonary effort is normal. No respiratory distress.     Breath sounds: Normal breath sounds.  Abdominal:     General: There is no distension.     Palpations: Abdomen is soft.     Tenderness: There is no abdominal tenderness. There is no right CVA tenderness or left CVA tenderness.  Musculoskeletal:     Cervical back: Normal range of motion and neck supple.  Lymphadenopathy:     Cervical: No cervical adenopathy.  Skin:    General: Skin is warm and dry.  Neurological:     General: No focal deficit present.     Mental Status: She is alert and oriented to person, place, and time.  Psychiatric:        Mood and Affect: Mood normal.         Behavior: Behavior normal.        Thought Content: Thought content normal.          Assessment & Plan:  Kidney stone- pt reports stone has passed and pain has resolved.  She has pain meds and Flomax  to take w/ her as she flies to [place] this weekend.  She has a urologist but she has not been seen recently.  Encouraged her to schedule a f/u based on the presence of additional stones in each kidney.  Pt expressed understanding and is in agreement w/ plan.   Ground glass opacity in L lung- new.  Seen on CT done to assess renal stones.  LLL.  Pt was tx'd w/ Zpack for possible atypical infxn.  F/u CT in 6 months is recommended as well as q2 yrs for 5 yrs to ensure stability.  Pt reports she feels fine, not coughing, no fevers.  CT ordered.  Pt expressed understanding and is in agreement w/ plan.

## 2023-02-04 NOTE — Patient Instructions (Addendum)
 Follow up as needed or as scheduled Take your meds on your trip to ward off evil spirits! Drink LOTS of water Avoid soda and tea Call and schedule with your urologist when you get back Call with any questions or concerns ENJOY YOUR TRIP!!!

## 2023-02-11 ENCOUNTER — Telehealth: Payer: Self-pay | Admitting: Family Medicine

## 2023-02-11 NOTE — Telephone Encounter (Signed)
 Type of form received:UMR   Additional comments: CT  for thorax diagnostic without contrast material  Received YT:[id number]- Front Desk   Form should be Faxed/mailed to:N/A  Is patient requesting call for pickup:N/A  Form placed: [employer] charge sheet.  Provider will determine charge.N/A  Individual made aware of 3-5 business day turn around No?

## 2023-02-11 NOTE — Telephone Encounter (Signed)
Form placed in folder

## 2023-02-13 NOTE — Telephone Encounter (Signed)
Reviewed. No action needed.

## 2023-02-17 NOTE — Telephone Encounter (Signed)
In basket to be scanned

## 2023-02-17 NOTE — Telephone Encounter (Signed)
A paper copy of CT scan came today just FYI.

## 2023-02-18 ENCOUNTER — Encounter: Payer: Self-pay | Admitting: Family Medicine

## 2023-02-25 ENCOUNTER — Telehealth: Payer: Self-pay

## 2023-02-25 NOTE — Telephone Encounter (Signed)
Copied from CRM [id number]. Topic: Referral - Question >> Feb 24, 2023  4:30 PM Truddie Crumble wrote: Reason for CRM: Patient would like to be called regarding a CT scan. The referral coordinator stated that the approval expires on 2/7 and she is supposed to have  the scan around mid-may

## 2023-02-25 NOTE — Telephone Encounter (Signed)
Please advise  Pt is to have CT done in May correct? Scheduling CT told her this will Exp 02/03/2023

## 2023-02-25 NOTE — Telephone Encounter (Signed)
The CT is supposed to be done in late May/June.  I'm not sure why they got the authorization for a study that won't be done for 5 months.  We will just have to re-order it closer to the time

## 2023-02-26 NOTE — Telephone Encounter (Signed)
Scan set for mid May needs clearance to be completed early May

## 2023-02-26 NOTE — Telephone Encounter (Signed)
Patient has been informed.

## 2023-03-23 ENCOUNTER — Other Ambulatory Visit: Payer: Self-pay | Admitting: Obstetrics and Gynecology

## 2023-03-23 DIAGNOSIS — Z1231 Encounter for screening mammogram for malignant neoplasm of breast: Secondary | ICD-10-CM

## 2023-03-26 ENCOUNTER — Ambulatory Visit
Admission: RE | Admit: 2023-03-26 | Discharge: 2023-03-26 | Disposition: A | Discharge status: Home / Self Care | Payer: Commercial Managed Care - PPO | Source: Ambulatory Visit | Attending: Obstetrics and Gynecology | Admitting: Obstetrics and Gynecology

## 2023-03-26 DIAGNOSIS — Z1231 Encounter for screening mammogram for malignant neoplasm of breast: Secondary | ICD-10-CM

## 2023-05-04 ENCOUNTER — Encounter: Payer: Self-pay | Admitting: Family Medicine

## 2023-05-04 ENCOUNTER — Ambulatory Visit (INDEPENDENT_AMBULATORY_CARE_PROVIDER_SITE_OTHER): Payer: Commercial Managed Care - PPO | Admitting: Family Medicine

## 2023-05-04 VITALS — BP 132/84 | HR 70 | Temp 98.0°F | Ht 65.0 in | Wt 173.0 lb

## 2023-05-04 DIAGNOSIS — I10 Essential (primary) hypertension: Secondary | ICD-10-CM | POA: Diagnosis not present

## 2023-05-04 DIAGNOSIS — Z0000 Encounter for general adult medical examination without abnormal findings: Secondary | ICD-10-CM | POA: Diagnosis not present

## 2023-05-04 DIAGNOSIS — G8929 Other chronic pain: Secondary | ICD-10-CM

## 2023-05-04 DIAGNOSIS — M25511 Pain in right shoulder: Secondary | ICD-10-CM

## 2023-05-04 DIAGNOSIS — E559 Vitamin D deficiency, unspecified: Secondary | ICD-10-CM | POA: Diagnosis not present

## 2023-05-04 DIAGNOSIS — E538 Deficiency of other specified B group vitamins: Secondary | ICD-10-CM

## 2023-05-04 LAB — BASIC METABOLIC PANEL WITH GFR
BUN: 15 mg/dL (ref 6–23)
CO2: 29 meq/L (ref 19–32)
Calcium: 9.5 mg/dL (ref 8.4–10.5)
Chloride: 105 meq/L (ref 96–112)
Creatinine, Ser: 0.81 mg/dL (ref 0.40–1.20)
GFR: 75.8 mL/min (ref >60.00)
Glucose, Bld: 108 mg/dL — ABNORMAL HIGH (ref 70–99)
Potassium: 3.9 meq/L (ref 3.5–5.1)
Sodium: 141 meq/L (ref 135–145)

## 2023-05-04 LAB — LIPID PANEL
Cholesterol: 175 mg/dL (ref 0–200)
HDL: 55.5 mg/dL (ref >39.00)
LDL Cholesterol: 107 mg/dL — ABNORMAL HIGH (ref 0–99)
NonHDL: 119.41
Total CHOL/HDL Ratio: 3
Triglycerides: 64 mg/dL (ref 0.0–149.0)
VLDL: 12.8 mg/dL (ref 0.0–40.0)

## 2023-05-04 LAB — CBC WITH DIFFERENTIAL/PLATELET
Basophils Absolute: 0 10*3/uL (ref 0.0–0.1)
Basophils Relative: 1 % (ref 0.0–3.0)
Eosinophils Absolute: 0.1 10*3/uL (ref 0.0–0.7)
Eosinophils Relative: 4 % (ref 0.0–5.0)
HCT: 41.7 % (ref 36.0–46.0)
Hemoglobin: 13.8 g/dL (ref 12.0–15.0)
Lymphocytes Relative: 48.3 % — ABNORMAL HIGH (ref 12.0–46.0)
Lymphs Abs: 1.2 10*3/uL (ref 0.7–4.0)
MCHC: 33 g/dL (ref 30.0–36.0)
MCV: 101 fl — ABNORMAL HIGH (ref 78.0–100.0)
Monocytes Absolute: 0.2 10*3/uL (ref 0.1–1.0)
Monocytes Relative: 8.6 % (ref 3.0–12.0)
Neutro Abs: 1 10*3/uL — ABNORMAL LOW (ref 1.4–7.7)
Neutrophils Relative %: 38.1 % — ABNORMAL LOW (ref 43.0–77.0)
Platelets: 213 10*3/uL (ref 150.0–400.0)
RBC: 4.13 Mil/uL (ref 3.87–5.11)
RDW: 13.4 % (ref 11.5–15.5)
WBC: 2.6 10*3/uL — ABNORMAL LOW (ref 4.0–10.5)

## 2023-05-04 LAB — HEPATIC FUNCTION PANEL
ALT: 18 U/L (ref 0–35)
AST: 20 U/L (ref 0–37)
Albumin: 4.3 g/dL (ref 3.5–5.2)
Alkaline Phosphatase: 59 U/L (ref 39–117)
Bilirubin, Direct: 0.1 mg/dL (ref 0.0–0.3)
Total Bilirubin: 0.6 mg/dL (ref 0.2–1.2)
Total Protein: 7.5 g/dL (ref 6.0–8.3)

## 2023-05-04 LAB — TSH: TSH: 1.57 u[IU]/mL (ref 0.35–5.50)

## 2023-05-04 LAB — B12 AND FOLATE PANEL
Folate: >25.2 ng/mL (ref >5.9)
Vitamin B-12: 419 pg/mL (ref 211–911)

## 2023-05-04 LAB — VITAMIN D 25 HYDROXY (VIT D DEFICIENCY, FRACTURES): VITD: 34.29 ng/mL (ref 30.00–100.00)

## 2023-05-04 NOTE — Patient Instructions (Signed)
 Follow up in 6 months to recheck BP and cholesterol We'll notify you of your lab results and make any changes if needed Continue to work on healthy diet and regular exercise- you can do it! We'll call you to schedule your Ortho appt Call with any questions or concerns Stay Safe!  Stay Healthy! Happy Spring!!!

## 2023-05-04 NOTE — Assessment & Plan Note (Signed)
 Pt's PE WNL.  UTD on colonoscopy, mammo, Tdap, PNA.  Check labs.  Anticipatory guidance provided.

## 2023-05-04 NOTE — Progress Notes (Signed)
   Subjective:    Patient ID: Brandy May, female    DOB: 1955/09/18, 68 y.o.   MRN: [id number]  HPI CPE- UTD on colonoscopy, mammo, Tdap, PNA  Patient Care Team    Relationship Specialty Notifications Start End  Sheliah Hatch, MD PCP - General Family Medicine  12/23/10   Lauralyn Primes., MD Consulting Physician Dentistry  10/11/14   Malen Gauze, MD Consulting Physician Urology  10/11/14   Marzella Schlein., MD Consulting Physician Ophthalmology  10/11/14   Maxie Better, MD Consulting Physician Obstetrics and Gynecology  10/11/14   Sharrell Ku, MD Consulting Physician Gastroenterology  10/11/14   Sharrell Ku, MD Consulting Physician Gastroenterology  02/19/23     Health Maintenance  Topic Date Due   INFLUENZA VACCINE  08/28/2023   MAMMOGRAM  03/25/2024   Colonoscopy  06/26/2027   DTaP/Tdap/Td (3 - Td or Tdap) 06/08/2029   Pneumonia Vaccine 65+ Years old  Completed   DEXA SCAN  Completed   Hepatitis C Screening  Completed   Zoster Vaccines- Shingrix  Completed   HPV VACCINES  Aged Out   COVID-19 Vaccine  Discontinued      Review of Systems Patient reports no vision/ hearing changes, adenopathy,fever, weight change,  persistant/recurrent hoarseness , swallowing issues, chest pain, palpitations, edema, persistant/recurrent cough, hemoptysis, dyspnea (rest/exertional/paroxysmal nocturnal), gastrointestinal bleeding (melena, rectal bleeding), abdominal pain, significant heartburn, bowel changes, GU symptoms (dysuria, hematuria, incontinence), Gyn symptoms (abnormal  bleeding, pain),  syncope, focal weakness, memory loss, numbness & tingling, skin/hair/nail changes, abnormal bruising or bleeding, anxiety, or depression.     Objective:   Physical Exam General Appearance:    Alert, cooperative, no distress, appears stated age  Head:    Normocephalic, without obvious abnormality, atraumatic  Eyes:    PERRL, conjunctiva/corneas clear, EOM's intact both  eyes  Ears:    Normal TM's and external ear canals, both ears  Nose:   Nares normal, septum midline, mucosa normal, no drainage    or sinus tenderness  Throat:   Lips, mucosa, and tongue normal; teeth and gums normal  Neck:   Supple, symmetrical, trachea midline, no adenopathy;    Thyroid: no enlargement/tenderness/nodules  Back:     Symmetric, no curvature, ROM normal, no CVA tenderness  Lungs:     Clear to auscultation bilaterally, respirations unlabored  Chest Wall:    No tenderness or deformity   Heart:    Regular rate and rhythm, S1 and S2 normal, no murmur, rub   or gallop  Breast Exam:    Deferred to GYN  Abdomen:     Soft, non-tender, bowel sounds active all four quadrants,    no masses, no organomegaly  Genitalia:    Deferred to GYN  Rectal:    Extremities:   Extremities normal, atraumatic, no cyanosis or edema  Pulses:   2+ and symmetric all extremities  Skin:   Skin color, texture, turgor normal, no rashes or lesions  Lymph nodes:   Cervical, supraclavicular, and axillary nodes normal  Neurologic:   CNII-XII intact, normal strength, sensation and reflexes    throughout          Assessment & Plan:

## 2023-05-05 ENCOUNTER — Telehealth: Payer: Self-pay

## 2023-05-05 ENCOUNTER — Encounter: Payer: Self-pay | Admitting: Family Medicine

## 2023-05-05 NOTE — Telephone Encounter (Signed)
 Pt has reviewed labs via MyChart

## 2023-05-05 NOTE — Telephone Encounter (Signed)
-----   Message from Neena Rhymes sent at 05/05/2023  7:43 AM EDT ----- Labs are stable and look good!  No changes at this time  (Please note- I may not comment on every lab value that is noted as abnormal.  Trust when I say I review all of them, compare them to previous values, and put them in the correct medical context.  If I have any concerns- or any follow up or changes are needed- I promise to let you know)

## 2023-07-01 ENCOUNTER — Ambulatory Visit (HOSPITAL_BASED_OUTPATIENT_CLINIC_OR_DEPARTMENT_OTHER)
Admission: RE | Admit: 2023-07-01 | Discharge: 2023-07-01 | Disposition: A | Discharge status: Home / Self Care | Source: Ambulatory Visit | Attending: Family Medicine | Admitting: Family Medicine

## 2023-07-01 DIAGNOSIS — R918 Other nonspecific abnormal finding of lung field: Secondary | ICD-10-CM | POA: Diagnosis present

## 2023-07-09 ENCOUNTER — Ambulatory Visit: Payer: Self-pay | Admitting: Family Medicine

## 2023-08-20 ENCOUNTER — Ambulatory Visit: Admitting: Student in an Organized Health Care Education/Training Program

## 2023-08-20 ENCOUNTER — Encounter: Payer: Self-pay | Admitting: Student in an Organized Health Care Education/Training Program

## 2023-08-20 VITALS — BP 142/78 | HR 70 | Wt 173.0 lb

## 2023-08-20 DIAGNOSIS — S90211A Contusion of right great toe with damage to nail, initial encounter: Secondary | ICD-10-CM

## 2023-08-20 NOTE — Progress Notes (Signed)
   Acute Office Visit  Subjective:     Patient ID: Brandy May, female    DOB: November 05, 1955, 68 y.o.   MRN: [id number]  Chief Complaint  Patient presents with   Nail Problem    Right big toe, toe nail is green and patient states it seems to be falling off.  Patient states she had a manicure done a couple weeks ago and than about 1 week ago she fell and her foot was black and blue.     HPI  Discussed the use of AI scribe software for clinical note transcription with the patient, who gave verbal consent to proceed.  History of Present Illness Brandy May is a 68 year old female who presents with a discolored and lifting toenail on her right foot after dropping a can on it.  Approximately four to five weeks ago, she dropped a can on her right foot. Initially, she experienced some pain, but it subsided quickly. She did not notice any significant issues at the time due to having a manicure.  During a subsequent pedicure, the toenail was observed to be black and bruised. The pedicurist mentioned that the nail might come off. Upon removing the nail polish, the nail was lifting and had a green discoloration.  No pain in the affected area. She denies having diabetes and confirms that the nails are her own and not acrylic, with only polish applied.      Objective:    BP (!) 142/78   Pulse 70   Wt 173 lb (78.5 kg)   LMP 01/13/2012   BMI 28.79 kg/m   Physical Exam  Gen: Well appearing woman MSK: Right foot great toe looks normal, no discoloration of the skin, the toenail has an ecchymosis and was falling off.  I was able to remove the toenail with just my hands.  Underneath there is already new toenail regrowing.  There was a little bit of bleeding at the cuticle, the pressure was held, the toe was wrapped with a gauze bandage.     Assessment & Plan:   Problem List Items Addressed This Visit       Unprioritized   Contusion of right great toe with damage to  nail - Primary   Nail trauma with detachment and new nail growth.  I removed the old toenail.  No infection or fungal involvement. Apply gauze wrap to toe today, then remove. No antibiotics needed. Reassured nail will regrow, may be discolored and misshaped initially. Resume normal pedicures once nail fully regrown.       Return if symptoms worsen or fail to improve.  Cleatus Debby Specking, MD

## 2023-08-20 NOTE — Assessment & Plan Note (Signed)
 Nail trauma with detachment and new nail growth.  I removed the old toenail.  No infection or fungal involvement. Apply gauze wrap to toe today, then remove. No antibiotics needed. Reassured nail will regrow, may be discolored and misshaped initially. Resume normal pedicures once nail fully regrown.

## 2023-08-20 NOTE — Patient Instructions (Signed)
  VISIT SUMMARY: Today, you were seen for a discolored and lifting toenail on your right foot, which occurred after you dropped a can on it about four to five weeks ago. You initially experienced some pain, but it subsided quickly. Recently, you noticed the nail was black, bruised, and lifting with a green discoloration after removing nail polish.  YOUR PLAN: -TOENAIL TRAUMA: Toenail trauma occurs when the nail is injured, often resulting in discoloration, bruising, and sometimes detachment. In your case, there is no infection or fungal involvement. You should apply a gauze wrap to the toe today and then remove it. No antibiotics are needed. The nail will regrow, although it may initially be discolored and misshaped. You can resume normal pedicures once the nail has fully regrown.

## 2023-08-26 DIAGNOSIS — M1990 Unspecified osteoarthritis, unspecified site: Secondary | ICD-10-CM | POA: Diagnosis not present

## 2023-08-26 DIAGNOSIS — I10 Essential (primary) hypertension: Secondary | ICD-10-CM | POA: Diagnosis not present

## 2023-08-26 DIAGNOSIS — Z6827 Body mass index (BMI) 27.0-27.9, adult: Secondary | ICD-10-CM | POA: Diagnosis not present

## 2023-08-26 DIAGNOSIS — E785 Hyperlipidemia, unspecified: Secondary | ICD-10-CM | POA: Diagnosis not present

## 2023-08-26 DIAGNOSIS — Z008 Encounter for other general examination: Secondary | ICD-10-CM | POA: Diagnosis not present

## 2023-08-26 DIAGNOSIS — E663 Overweight: Secondary | ICD-10-CM | POA: Diagnosis not present

## 2023-10-21 ENCOUNTER — Other Ambulatory Visit: Payer: Self-pay | Admitting: Family Medicine

## 2023-10-21 MED ORDER — AMLODIPINE BESYLATE 5 MG PO TABS: 5.0000 mg | ORAL_TABLET | Freq: Every day | ORAL | 3 refills | Status: AC

## 2023-10-21 NOTE — Telephone Encounter (Signed)
 Copied from CRM #[id number]. Topic: Clinical - Medication Refill >> Oct 21, 2023  3:03 PM Timindy P wrote: Medication:  amLODipine  (NORVASC ) 5 MG tablet   Has the patient contacted their pharmacy? No (Agent: If no, request that the patient contact the pharmacy for the refill. If patient does not wish to contact the pharmacy document the reason why and proceed with request.) (Agent: If yes, when and what did the pharmacy advise?)  This is the patient's preferred pharmacy:  [id number] [place] [place], [place] - [address] [place] [address] Phone: [phone] Fax: [phone]  Is this the correct pharmacy for this prescription? Yes If no, delete pharmacy and type the correct one.   Has the prescription been filled recently? No  Is the patient out of the medication? No  Has the patient been seen for an appointment in the last year OR does the patient have an upcoming appointment? Yes  Can we respond through MyChart? Yes  Agent: Please be advised that Rx refills may take up to 3 business days. We ask that you follow-up with your pharmacy.

## 2023-11-03 ENCOUNTER — Ambulatory Visit: Admitting: Family Medicine

## 2023-11-03 VITALS — BP 120/68 | HR 79 | Temp 97.9°F | Ht 65.0 in | Wt 171.4 lb

## 2023-11-03 DIAGNOSIS — Z23 Encounter for immunization: Secondary | ICD-10-CM

## 2023-11-03 DIAGNOSIS — I10 Essential (primary) hypertension: Secondary | ICD-10-CM

## 2023-11-03 DIAGNOSIS — E663 Overweight: Secondary | ICD-10-CM | POA: Diagnosis not present

## 2023-11-03 DIAGNOSIS — E785 Hyperlipidemia, unspecified: Secondary | ICD-10-CM

## 2023-11-03 LAB — BASIC METABOLIC PANEL WITH GFR
BUN: 15 mg/dL (ref 6–23)
CO2: 30 meq/L (ref 19–32)
Calcium: 9.7 mg/dL (ref 8.4–10.5)
Chloride: 104 meq/L (ref 96–112)
Creatinine, Ser: 0.71 mg/dL (ref 0.40–1.20)
GFR: 88.47 mL/min (ref >60.00)
Glucose, Bld: 86 mg/dL (ref 70–99)
Potassium: 3.7 meq/L (ref 3.5–5.1)
Sodium: 141 meq/L (ref 135–145)

## 2023-11-03 LAB — CBC WITH DIFFERENTIAL/PLATELET
Basophils Absolute: 0 K/uL (ref 0.0–0.1)
Basophils Relative: 1.1 % (ref 0.0–3.0)
Eosinophils Absolute: 0.1 K/uL (ref 0.0–0.7)
Eosinophils Relative: 3.7 % (ref 0.0–5.0)
HCT: 38 % (ref 36.0–46.0)
Hemoglobin: 12.5 g/dL (ref 12.0–15.0)
Lymphocytes Relative: 45.4 % (ref 12.0–46.0)
Lymphs Abs: 1.4 K/uL (ref 0.7–4.0)
MCHC: 33 g/dL (ref 30.0–36.0)
MCV: 99.4 fl (ref 78.0–100.0)
Monocytes Absolute: 0.3 K/uL (ref 0.1–1.0)
Monocytes Relative: 8.9 % (ref 3.0–12.0)
Neutro Abs: 1.2 K/uL — ABNORMAL LOW (ref 1.4–7.7)
Neutrophils Relative %: 40.9 % — ABNORMAL LOW (ref 43.0–77.0)
Platelets: 221 K/uL (ref 150.0–400.0)
RBC: 3.82 Mil/uL — ABNORMAL LOW (ref 3.87–5.11)
RDW: 13.7 % (ref 11.5–15.5)
WBC: 3 K/uL — ABNORMAL LOW (ref 4.0–10.5)

## 2023-11-03 LAB — HEPATIC FUNCTION PANEL
ALT: 19 U/L (ref 0–35)
AST: 25 U/L (ref 0–37)
Albumin: 4.3 g/dL (ref 3.5–5.2)
Alkaline Phosphatase: 55 U/L (ref 39–117)
Bilirubin, Direct: 0.1 mg/dL (ref 0.0–0.3)
Total Bilirubin: 0.6 mg/dL (ref 0.2–1.2)
Total Protein: 7.5 g/dL (ref 6.0–8.3)

## 2023-11-03 LAB — LIPID PANEL
Cholesterol: 171 mg/dL (ref 0–200)
HDL: 61.6 mg/dL (ref >39.00)
LDL Cholesterol: 98 mg/dL (ref 0–99)
NonHDL: 109.8
Total CHOL/HDL Ratio: 3
Triglycerides: 57 mg/dL (ref 0.0–149.0)
VLDL: 11.4 mg/dL (ref 0.0–40.0)

## 2023-11-03 LAB — TSH: TSH: 1.36 u[IU]/mL (ref 0.35–5.50)

## 2023-11-03 MED ORDER — ROSUVASTATIN CALCIUM 10 MG PO TABS: 10.0000 mg | ORAL_TABLET | Freq: Every day | ORAL | 3 refills | Status: AC

## 2023-11-03 NOTE — Progress Notes (Signed)
   Subjective:    Patient ID: Brandy May, female    DOB: 1956-01-05, 68 y.o.   MRN: [id number]  HPI HTN- chronic problem, on Amlodipine  5mg  daily w/ good control.  Denies CP, SOB, HA's, visual changes, edema.  Hyperlipidemia- chronic problem, on Crestor  10mg  daily.  Denies abd pain, N/V.  Overweight- weight and BMI are stable.  Continues to exercise- walking regularly.  Signed up for [employer]   Review of Systems For ROS see HPI     Objective:   Physical Exam Vitals reviewed.  Constitutional:      General: She is not in acute distress.    Appearance: Normal appearance. She is well-developed. She is not ill-appearing.  HENT:     Head: Normocephalic and atraumatic.  Eyes:     Conjunctiva/sclera: Conjunctivae normal.     Pupils: Pupils are equal, round, and reactive to light.  Neck:     Thyroid : No thyromegaly.  Cardiovascular:     Rate and Rhythm: Normal rate and regular rhythm.     Pulses: Normal pulses.     Heart sounds: Normal heart sounds. No murmur heard. Pulmonary:     Effort: Pulmonary effort is normal. No respiratory distress.     Breath sounds: Normal breath sounds.  Abdominal:     General: There is no distension.     Palpations: Abdomen is soft.     Tenderness: There is no abdominal tenderness.  Musculoskeletal:     Cervical back: Normal range of motion and neck supple.     Right lower leg: No edema.     Left lower leg: No edema.  Lymphadenopathy:     Cervical: No cervical adenopathy.  Skin:    General: Skin is warm and dry.  Neurological:     General: No focal deficit present.     Mental Status: She is alert and oriented to person, place, and time.  Psychiatric:        Mood and Affect: Mood normal.        Behavior: Behavior normal.        Thought Content: Thought content normal.           Assessment & Plan:

## 2023-11-03 NOTE — Assessment & Plan Note (Signed)
 Weight and BMI are stable.  She is now walking regularly and just joined the gym.  She plans to incorporate weight training.  Applauded her efforts.  Will follow.

## 2023-11-03 NOTE — Assessment & Plan Note (Signed)
 Chronic problem.  On Crestor  10mg  daily w/o difficulty.  Check labs.  Adjust meds prn

## 2023-11-03 NOTE — Patient Instructions (Signed)
 Schedule your complete physical in 6 months We'll notify you of your lab results and make any changes if needed Keep up the good work on healthy diet and regular exercise- you look great! Call with any questions or concerns Stay Safe!  Stay Healthy! ENJOY YOUR TRAVELS!!!

## 2023-11-03 NOTE — Assessment & Plan Note (Signed)
Chronic problem. On Amlodipine 5mg  daily w/ good control.  Currently asymptomatic.  No med changes at this time

## 2023-11-04 ENCOUNTER — Ambulatory Visit: Payer: Self-pay | Admitting: Family Medicine

## 2023-11-12 ENCOUNTER — Ambulatory Visit: Payer: Self-pay

## 2023-11-12 NOTE — Telephone Encounter (Signed)
 I called patient and she said that she does not want to go to UC. She said that she wants oxycodone  5mg  and zofran . She believes its a kidney stone and not a UTI or anything else.   [facility] [place] [place]  [address]

## 2023-11-12 NOTE — Telephone Encounter (Signed)
 FYI Only or Action Required?: Action required by provider: clinical question for provider and update on patient condition.  Patient was last seen in primary care on 11/03/2023 by Mahlon Comer BRAVO, MD.  Called Nurse Triage reporting Nausea, Back Pain, and Urinary Frequency.  Symptoms began yesterday.  Interventions attempted: Nothing.  Symptoms are: gradually worsening.  Triage Disposition: See Physician Within 24 Hours  Patient/caregiver understands and will follow disposition?: Yes   Copied from CRM #[id number]. Topic: Clinical - Red Word Triage >> Nov 12, 2023  4:42 PM Dedra B wrote: Red Word that prompted transfer to Nurse Triage: Pt is out of town and experiencing nausea, back pain, and urinary frequency. She thinks she has kidney stones. Warm transfer to NT. Reason for Disposition  Urinating more frequently than usual (i.e., frequency) OR new-onset of the feeling of an urgent need to urinate (i.e., urgency)  Answer Assessment - Initial Assessment Questions Patient requests rx for oxycodone  5mg , and ondansetron  4mg   Will go to UC or ED without response from PCP today  [facility] [place] [place]  [address]   .1. SYMPTOM: What's the main symptom you're concerned about? (e.g., frequency, incontinence)     Urinary frequency, back pain, nausea 2. ONSET: When did the  symptoms  start?     One day ago 3. PAIN: Is there any pain? If Yes, ask: How bad is it? (Scale: 1-10; mild, moderate, severe)     6/10 4. CAUSE: What do you think is causing the symptoms?     Possible UTI or stone 5. OTHER SYMPTOMS: Do you have any other symptoms? (e.g., blood in urine, fever, flank pain, pain with urination)     Mild burning with urination 6. PREGNANCY: Is there any chance you are pregnant? When was your last menstrual period?     N/a  Protocols used: Urinary Symptoms-A-AH

## 2023-11-13 DIAGNOSIS — R110 Nausea: Secondary | ICD-10-CM | POA: Diagnosis not present

## 2023-11-13 DIAGNOSIS — N200 Calculus of kidney: Secondary | ICD-10-CM | POA: Diagnosis not present

## 2023-11-13 NOTE — Telephone Encounter (Signed)
 Called patient and relayed message. She made an appointment this morning at an urgent care to be evaluated. Sending as FYI

## 2023-11-13 NOTE — Telephone Encounter (Signed)
 While I would love to treat her symptoms w/ pain meds and nausea meds, unfortunately she has a history of an obstructing kidney stone and this could also be pyelonephritis (a kidney infection)- which if not treated w/ antibiotics can lead to sepsis and severe complications.  She needs to be evaluated and have a urine done in order to treat appropriately.  Since out of town, strongly recommend Urgent Care

## 2023-12-12 ENCOUNTER — Other Ambulatory Visit: Payer: Self-pay

## 2023-12-12 ENCOUNTER — Ambulatory Visit
Admission: RE | Admit: 2023-12-12 | Discharge: 2023-12-12 | Disposition: A | Discharge status: Home / Self Care | Source: Ambulatory Visit

## 2023-12-12 VITALS — BP 156/93 | HR 78 | Temp 98.1°F | Resp 16

## 2023-12-12 DIAGNOSIS — L03213 Periorbital cellulitis: Secondary | ICD-10-CM | POA: Diagnosis not present

## 2023-12-12 MED ORDER — ERYTHROMYCIN 5 MG/GM OP OINT: TOPICAL_OINTMENT | OPHTHALMIC | 0 refills | Status: AC

## 2023-12-12 MED ORDER — AMOXICILLIN-POT CLAVULANATE 875-125 MG PO TABS: 1.0000 | ORAL_TABLET | Freq: Two times a day (BID) | ORAL | 0 refills | Status: AC

## 2023-12-12 NOTE — ED Triage Notes (Signed)
 Pt c/o left upper eyelid swellingx2d. Pt states woke up this morning with crusted drainage from left eye. Pt denies vision issues. Pt has 1+ swelling of left upper outer eye lid.

## 2023-12-12 NOTE — Discharge Instructions (Addendum)
 Symptoms and physical exam findings are most consistent with a left upper eyelid preseptal cellulitis.  This is an infection in the soft tissue around the eye.  We will treat this with antibiotics by mouth as well as a topical ointment.  We will treat with the following: Augmentin 875 mg twice daily for 5 days.  This is an antibiotic.  Take this with food.  Erythromycin apply a small amount to the left lower eyelid nightly for 5 days.  May use warm compresses 2-3 times a day for 5 to 10 minutes on the eye to help with swelling and pain.  Be cautious of the temperature as this area is sensitive to very hot or very cold. Return to urgent care or PCP if symptoms worsen or fail to resolve.

## 2023-12-12 NOTE — ED Provider Notes (Signed)
 [facility]    CSN: [id number] Arrival date & time: 12/12/23  0850      History   Chief Complaint Chief Complaint  Patient presents with   Eye Problem    Entered by patient    HPI Brandy May is a 68 y.o. female.   68 year old female presents urgent care with complaints of left upper eye lid swelling, crusting and pain.  This started about 2 days ago.  She reports that she was using cool compresses on the area.  She woke up this morning with a significant increase in the swelling as well as crusting over her eye.  The area is tender.  She denies any visual changes.  She does not wear contacts.  She has been working out in the yard recently.  She denies any injury to the eye that she is aware of.  The pain is around the eye not in the eye.  She does wear glasses.   Eye Problem Associated symptoms: discharge and redness   Associated symptoms: no vomiting     Past Medical History:  Diagnosis Date   History of DVT of lower extremity 1991   w/ second pregancy   History of kidney stones 2016   History of peptic ulcer 1998   Hyperlipidemia    Hypertension    Wears glasses     Patient Active Problem List   Diagnosis Date Noted   Contusion of right great toe with damage to nail 08/20/2023   Moderate obstructive sleep apnea 02/07/2021   Overweight (BMI 25.0-29.9) 06/09/2019   Hip arthritis 05/20/2018   Vitamin D  deficiency 10/24/2016   Bilateral low back pain without sciatica 07/17/2014   Pain, joint, hand 06/11/2012   Physical exam, annual 10/03/2011   Thyromegaly 04/11/2011   HTN (hypertension) 12/29/2010   Hyperlipidemia 12/29/2010   B12 deficiency 12/29/2010   Maternal DVT (deep vein thrombosis), history of 12/29/2010    Past Surgical History:  Procedure Laterality Date   CESAREAN SECTION  1989 & 1991   CYSTOSCOPY W/ URETERAL STENT PLACEMENT Right 08/30/2014   Procedure: CYSTOSCOPY WITH STENT REPLACEMENT;  Surgeon: Belvie LITTIE Clara, MD;   Location: [facility];  Service: Urology;  Laterality: Right;   CYSTOSCOPY WITH URETEROSCOPY AND STENT PLACEMENT Right 08/16/2014   Procedure: CYSTOSCOPY WITH URETEROSCOPY AND STENT PLACEMENT;  Surgeon: Belvie LITTIE Clara, MD;  Location: [facility];  Service: Urology;  Laterality: Right;   CYSTOSCOPY/RETROGRADE/URETEROSCOPY Right 06/09/2016   Procedure: CYSTOSCOPY/RETROGRADE/URETEROSCOPY WITH LASER/STENT PLACEMENT AND STONE BASKETRY;  Surgeon: Clara Belvie LITTIE, MD;  Location: [facility];  Service: Urology;  Laterality: Right;   CYSTOSCOPY/RETROGRADE/URETEROSCOPY/STONE EXTRACTION WITH BASKET Right 08/30/2014   Procedure: CYSTOSCOPY/RETROGRADE/URETEROSCOPY/STONE EXTRACTION  ;  Surgeon: Belvie LITTIE Clara, MD;  Location[facility];  Service: Urology;  Laterality: Right;   KNEE ARTHROSCOPY Right 07-19-2010   RIGHT THUMB TRIGGER RELEASE  2014   TUBAL LIGATION  1994    OB History   No obstetric history on file.      Home Medications    Prior to Admission medications   Medication Sig Start Date End Date Taking? Authorizing Provider  amoxicillin-clavulanate (AUGMENTIN) 875-125 MG tablet Take 1 tablet by mouth every 12 (twelve) hours for 5 days. 12/12/23 12/17/23 Yes Zorawar Strollo A, PA-C  erythromycin ophthalmic ointment Place a 1/2 inch ribbon of ointment into the left lower eyelid nightly for 5 days. 12/12/23  Yes Ethin Drummond A, PA-C  Multiple Vitamin (MULTIVITAMIN) tablet Take  1 tablet by mouth daily.   Yes [provider]  Vitamin D , Cholecalciferol, 25 MCG (1000 UT) CAPS Take 1,000 Units by mouth daily.   Yes [provider]  amLODipine  (NORVASC ) 5 MG tablet Take 1 tablet (5 mg total) by mouth daily. 10/21/23   Tabori, Katherine E, MD  Estradiol 0.25 MG/0.25GM GEL     [provider]  rosuvastatin  (CRESTOR ) 10 MG tablet Take 1 tablet (10 mg total) by mouth daily. 11/03/23   Mahlon Comer BRAVO, MD     Family History Family History  Problem Relation Age of Onset   Arthritis Mother    Hyperlipidemia Mother    Hypertension Mother    Cancer Father    Hyperlipidemia Sister    Breast cancer Neg Hx     Social History Social History   Tobacco Use   Smoking status: Never   Smokeless tobacco: Never  Vaping Use   Vaping status: Never Used  Substance Use Topics   Alcohol use: No   Drug use: No     Allergies   Kiwi extract and Coumadin [warfarin sodium]   Review of Systems Review of Systems  Constitutional:  Negative for chills and fever.  HENT:  Negative for ear pain and sore throat.   Eyes:  Positive for pain, discharge and redness. Negative for visual disturbance.  Respiratory:  Negative for cough and shortness of breath.   Cardiovascular:  Negative for chest pain and palpitations.  Gastrointestinal:  Negative for abdominal pain and vomiting.  Genitourinary:  Negative for dysuria and hematuria.  Musculoskeletal:  Negative for arthralgias and back pain.  Skin:  Negative for color change and rash.  Neurological:  Negative for seizures and syncope.  All other systems reviewed and are negative.    Physical Exam Triage Vital Signs ED Triage Vitals  Encounter Vitals Group     BP 12/12/23 0901 (!) 156/93     Girls Systolic BP Percentile --      Girls Diastolic BP Percentile --      Boys Systolic BP Percentile --      Boys Diastolic BP Percentile --      Pulse Rate 12/12/23 0901 78     Resp 12/12/23 0901 16     Temp 12/12/23 0901 98.1 F (36.7 C)     Temp Source 12/12/23 0901 Oral     SpO2 12/12/23 0901 95 %     Weight --      Height --      Head Circumference --      Peak Flow --      Pain Score 12/12/23 0858 2     Pain Loc --      Pain Education --      Exclude from Growth Chart --    No data found.  Updated Vital Signs BP (!) 156/93   Pulse 78   Temp 98.1 F (36.7 C) (Oral)   Resp 16   LMP 01/13/2012   SpO2 95%   Visual Acuity Right Eye  Distance:   Left Eye Distance:   Bilateral Distance:    Right Eye Near:   Left Eye Near:    Bilateral Near:     Physical Exam Vitals and nursing note reviewed.  Constitutional:      General: She is not in acute distress.    Appearance: She is well-developed.  HENT:     Head: Normocephalic and atraumatic.  Eyes:     Extraocular Movements: Extraocular movements intact.  Conjunctiva/sclera: Conjunctivae normal.     Left eye: Left conjunctiva is not injected.   Cardiovascular:     Rate and Rhythm: Normal rate and regular rhythm.     Heart sounds: No murmur heard. Pulmonary:     Effort: Pulmonary effort is normal. No respiratory distress.     Breath sounds: Normal breath sounds.  Abdominal:     Palpations: Abdomen is soft.     Tenderness: There is no abdominal tenderness.  Musculoskeletal:        General: No swelling.     Cervical back: Neck supple.  Skin:    General: Skin is warm and dry.     Capillary Refill: Capillary refill takes less than 2 seconds.  Neurological:     Mental Status: She is alert.  Psychiatric:        Mood and Affect: Mood normal.      UC Treatments / Results  Labs (all labs ordered are listed, but only abnormal results are displayed) Labs Reviewed - No data to display  EKG   Radiology No results found.  Procedures Procedures (including critical care time)  Medications Ordered in UC Medications - No data to display  Initial Impression / Assessment and Plan / UC Course  I have reviewed the triage vital signs and the nursing notes.  Pertinent labs & imaging results that were available during my care of the patient were reviewed by me and considered in my medical decision making (see chart for details).     Preseptal cellulitis of left upper eyelid   Symptoms and physical exam findings are most consistent with a left upper eyelid preseptal cellulitis.  This is an infection in the soft tissue around the eye.  We will treat this with  antibiotics by mouth as well as a topical ointment.  We will treat with the following: Augmentin 875 mg twice daily for 5 days.  This is an antibiotic.  Take this with food.  Erythromycin apply a small amount to the left lower eyelid nightly for 5 days.  May use warm compresses 2-3 times a day for 5 to 10 minutes on the eye to help with swelling and pain.  Be cautious of the temperature as this area is sensitive to very hot or very cold. Return to urgent care or PCP if symptoms worsen or fail to resolve.    Final Clinical Impressions(s) / UC Diagnoses   Final diagnoses:  Preseptal cellulitis of left upper eyelid     Discharge Instructions      Symptoms and physical exam findings are most consistent with a left upper eyelid preseptal cellulitis.  This is an infection in the soft tissue around the eye.  We will treat this with antibiotics by mouth as well as a topical ointment.  We will treat with the following: Augmentin 875 mg twice daily for 5 days.  This is an antibiotic.  Take this with food.  Erythromycin apply a small amount to the left lower eyelid nightly for 5 days.  May use warm compresses 2-3 times a day for 5 to 10 minutes on the eye to help with swelling and pain.  Be cautious of the temperature as this area is sensitive to very hot or very cold. Return to urgent care or PCP if symptoms worsen or fail to resolve.      ED Prescriptions     Medication Sig Dispense Auth. Provider   amoxicillin-clavulanate (AUGMENTIN) 875-125 MG tablet Take 1 tablet by mouth every 12 (  twelve) hours for 5 days. 10 tablet Nikolis Berent A, PA-C   erythromycin ophthalmic ointment Place a 1/2 inch ribbon of ointment into the left lower eyelid nightly for 5 days. 3.5 g Teresa Almarie LABOR, [place]      PDMP not reviewed this encounter.   Teresa Almarie LABOR, [place] 12/12/23 [id number]

## 2024-05-04 ENCOUNTER — Encounter: Admitting: Family Medicine

## 2024-05-09 ENCOUNTER — Encounter: Admitting: Family Medicine
